# Patient Record
Sex: Female | Born: 1935 | Race: White | Hispanic: No | Marital: Married | Smoking: Former smoker
Health system: Southern US, Community
[De-identification: ages and names within clinical notes are randomized; demographics above are authoritative.]

## PROBLEM LIST (undated history)

## (undated) DIAGNOSIS — R194 Change in bowel habit: Secondary | ICD-10-CM

## (undated) DIAGNOSIS — I10 Essential (primary) hypertension: Secondary | ICD-10-CM

## (undated) DIAGNOSIS — E785 Hyperlipidemia, unspecified: Secondary | ICD-10-CM

## (undated) DIAGNOSIS — M1990 Unspecified osteoarthritis, unspecified site: Secondary | ICD-10-CM

## (undated) DIAGNOSIS — M419 Scoliosis, unspecified: Secondary | ICD-10-CM

## (undated) HISTORY — PX: TONSILLECTOMY: SUR1361

## (undated) HISTORY — PX: CATARACT EXTRACTION: SUR2

---

## 1998-04-18 ENCOUNTER — Encounter: Payer: Self-pay | Admitting: Internal Medicine

## 1998-04-18 ENCOUNTER — Ambulatory Visit (HOSPITAL_COMMUNITY): Admission: RE | Admit: 1998-04-18 | Discharge: 1998-04-18 | Payer: Self-pay | Admitting: Internal Medicine

## 2007-07-28 ENCOUNTER — Encounter: Admission: RE | Admit: 2007-07-28 | Discharge: 2007-07-28 | Payer: Self-pay | Admitting: Family Medicine

## 2008-08-29 ENCOUNTER — Encounter: Admission: RE | Admit: 2008-08-29 | Discharge: 2008-08-29 | Payer: Self-pay | Admitting: Family Medicine

## 2009-09-25 ENCOUNTER — Encounter: Admission: RE | Admit: 2009-09-25 | Discharge: 2009-09-25 | Payer: Self-pay | Admitting: Family Medicine

## 2010-10-28 ENCOUNTER — Other Ambulatory Visit: Payer: Self-pay | Admitting: Family Medicine

## 2010-10-28 DIAGNOSIS — Z1231 Encounter for screening mammogram for malignant neoplasm of breast: Secondary | ICD-10-CM

## 2010-11-06 ENCOUNTER — Ambulatory Visit
Admission: RE | Admit: 2010-11-06 | Discharge: 2010-11-06 | Disposition: A | Discharge status: Home / Self Care | Payer: Medicare Other | Source: Ambulatory Visit | Attending: Family Medicine | Admitting: Family Medicine

## 2010-11-06 DIAGNOSIS — Z1231 Encounter for screening mammogram for malignant neoplasm of breast: Secondary | ICD-10-CM

## 2011-09-09 ENCOUNTER — Other Ambulatory Visit: Payer: Self-pay | Admitting: Orthopedic Surgery

## 2011-09-09 DIAGNOSIS — M5137 Other intervertebral disc degeneration, lumbosacral region: Secondary | ICD-10-CM

## 2011-09-11 ENCOUNTER — Ambulatory Visit
Admission: RE | Admit: 2011-09-11 | Discharge: 2011-09-11 | Disposition: A | Discharge status: Home / Self Care | Payer: Medicare Other | Source: Ambulatory Visit | Attending: Orthopedic Surgery | Admitting: Orthopedic Surgery

## 2011-09-11 ENCOUNTER — Ambulatory Visit
Admission: RE | Admit: 2011-09-11 | Discharge: 2011-09-11 | Disposition: A | Discharge status: Home / Self Care | Payer: BLUE CROSS/BLUE SHIELD | Source: Ambulatory Visit | Attending: Orthopedic Surgery | Admitting: Orthopedic Surgery

## 2011-09-11 DIAGNOSIS — M5137 Other intervertebral disc degeneration, lumbosacral region: Secondary | ICD-10-CM

## 2011-10-09 DIAGNOSIS — E785 Hyperlipidemia, unspecified: Secondary | ICD-10-CM | POA: Insufficient documentation

## 2011-10-14 DIAGNOSIS — M4120 Other idiopathic scoliosis, site unspecified: Secondary | ICD-10-CM | POA: Insufficient documentation

## 2011-12-16 DIAGNOSIS — M48061 Spinal stenosis, lumbar region without neurogenic claudication: Secondary | ICD-10-CM | POA: Insufficient documentation

## 2012-10-13 DIAGNOSIS — M810 Age-related osteoporosis without current pathological fracture: Secondary | ICD-10-CM | POA: Insufficient documentation

## 2015-08-17 ENCOUNTER — Other Ambulatory Visit: Payer: Self-pay | Admitting: Family Medicine

## 2015-08-17 DIAGNOSIS — Z1231 Encounter for screening mammogram for malignant neoplasm of breast: Secondary | ICD-10-CM

## 2015-09-05 ENCOUNTER — Ambulatory Visit
Admission: RE | Admit: 2015-09-05 | Discharge: 2015-09-05 | Disposition: A | Discharge status: Home / Self Care | Payer: Medicare Other | Source: Ambulatory Visit | Attending: Family Medicine | Admitting: Family Medicine

## 2015-09-05 DIAGNOSIS — Z1231 Encounter for screening mammogram for malignant neoplasm of breast: Secondary | ICD-10-CM

## 2016-08-04 ENCOUNTER — Other Ambulatory Visit: Payer: Self-pay | Admitting: Family Medicine

## 2016-08-04 DIAGNOSIS — Z1231 Encounter for screening mammogram for malignant neoplasm of breast: Secondary | ICD-10-CM

## 2016-09-08 ENCOUNTER — Ambulatory Visit
Admission: RE | Admit: 2016-09-08 | Discharge: 2016-09-08 | Disposition: A | Discharge status: Home / Self Care | Payer: Medicare Other | Source: Ambulatory Visit | Attending: Family Medicine | Admitting: Family Medicine

## 2016-09-08 DIAGNOSIS — Z1231 Encounter for screening mammogram for malignant neoplasm of breast: Secondary | ICD-10-CM

## 2017-08-12 ENCOUNTER — Other Ambulatory Visit: Payer: Self-pay | Admitting: Family Medicine

## 2017-08-12 DIAGNOSIS — Z1231 Encounter for screening mammogram for malignant neoplasm of breast: Secondary | ICD-10-CM

## 2017-09-14 ENCOUNTER — Ambulatory Visit
Admission: RE | Admit: 2017-09-14 | Discharge: 2017-09-14 | Disposition: A | Discharge status: Home / Self Care | Payer: Medicare Other | Source: Ambulatory Visit | Attending: Family Medicine | Admitting: Family Medicine

## 2017-09-14 DIAGNOSIS — Z1231 Encounter for screening mammogram for malignant neoplasm of breast: Secondary | ICD-10-CM

## 2017-11-20 ENCOUNTER — Encounter: Payer: Self-pay | Admitting: *Deleted

## 2017-11-23 ENCOUNTER — Ambulatory Visit: Payer: Medicare Other | Admitting: Anesthesiology

## 2017-11-23 ENCOUNTER — Encounter
Admission: RE | Disposition: A | Discharge status: Home / Self Care | Payer: Self-pay | Source: Ambulatory Visit | Attending: Unknown Physician Specialty

## 2017-11-23 ENCOUNTER — Other Ambulatory Visit: Payer: Self-pay

## 2017-11-23 ENCOUNTER — Ambulatory Visit
Admission: RE | Admit: 2017-11-23 | Discharge: 2017-11-23 | Disposition: A | Discharge status: Home / Self Care | Payer: Medicare Other | Source: Ambulatory Visit | Attending: Unknown Physician Specialty | Admitting: Unknown Physician Specialty

## 2017-11-23 ENCOUNTER — Encounter: Payer: Self-pay | Admitting: Anesthesiology

## 2017-11-23 DIAGNOSIS — I10 Essential (primary) hypertension: Secondary | ICD-10-CM | POA: Insufficient documentation

## 2017-11-23 DIAGNOSIS — Z79899 Other long term (current) drug therapy: Secondary | ICD-10-CM | POA: Diagnosis not present

## 2017-11-23 DIAGNOSIS — K5730 Diverticulosis of large intestine without perforation or abscess without bleeding: Secondary | ICD-10-CM | POA: Insufficient documentation

## 2017-11-23 DIAGNOSIS — Z87891 Personal history of nicotine dependence: Secondary | ICD-10-CM | POA: Diagnosis not present

## 2017-11-23 DIAGNOSIS — K621 Rectal polyp: Secondary | ICD-10-CM | POA: Insufficient documentation

## 2017-11-23 DIAGNOSIS — Z7983 Long term (current) use of bisphosphonates: Secondary | ICD-10-CM | POA: Diagnosis not present

## 2017-11-23 DIAGNOSIS — Z7982 Long term (current) use of aspirin: Secondary | ICD-10-CM | POA: Insufficient documentation

## 2017-11-23 DIAGNOSIS — Z1211 Encounter for screening for malignant neoplasm of colon: Secondary | ICD-10-CM | POA: Diagnosis present

## 2017-11-23 HISTORY — DX: Change in bowel habit: R19.4

## 2017-11-23 HISTORY — DX: Scoliosis, unspecified: M41.9

## 2017-11-23 HISTORY — DX: Hyperlipidemia, unspecified: E78.5

## 2017-11-23 HISTORY — DX: Unspecified osteoarthritis, unspecified site: M19.90

## 2017-11-23 HISTORY — DX: Essential (primary) hypertension: I10

## 2017-11-23 HISTORY — PX: COLONOSCOPY WITH PROPOFOL: SHX5780

## 2017-11-23 SURGERY — COLONOSCOPY WITH PROPOFOL
Anesthesia: General

## 2017-11-23 MED ORDER — PROPOFOL 500 MG/50ML IV EMUL
INTRAVENOUS | Status: DC | PRN
  Administered 2017-11-23: 150 ug/kg/min via INTRAVENOUS

## 2017-11-23 MED ORDER — SODIUM CHLORIDE 0.9 % IV SOLN
INTRAVENOUS | Status: DC
  Administered 2017-11-23 (×2): via INTRAVENOUS

## 2017-11-23 MED ORDER — PROPOFOL 10 MG/ML IV BOLUS
INTRAVENOUS | Status: DC | PRN
  Administered 2017-11-23: 40 mg via INTRAVENOUS
  Administered 2017-11-23: 10 mg via INTRAVENOUS

## 2017-11-23 MED ORDER — SODIUM CHLORIDE 0.9 % IV SOLN: INTRAVENOUS | Status: DC

## 2017-11-23 MED ORDER — LIDOCAINE HCL (PF) 2 % IJ SOLN
INTRAMUSCULAR | Status: AC
  Filled 2017-11-23: qty 10

## 2017-11-23 MED ORDER — PROPOFOL 500 MG/50ML IV EMUL
INTRAVENOUS | Status: AC
  Filled 2017-11-23: qty 50

## 2017-11-23 NOTE — Anesthesia Postprocedure Evaluation (Signed)
Anesthesia Post Note  Patient: Barbara Galvan  Procedure(s) Performed: COLONOSCOPY WITH PROPOFOL (N/A )  Patient location during evaluation: Endoscopy Anesthesia Type: General Level of consciousness: awake and alert Pain management: pain level controlled Vital Signs Assessment: post-procedure vital signs reviewed and stable Respiratory status: spontaneous breathing, nonlabored ventilation, respiratory function stable and patient connected to nasal cannula oxygen Cardiovascular status: blood pressure returned to baseline and stable Postop Assessment: no apparent nausea or vomiting Anesthetic complications: no     Last Vitals:  Vitals:   11/23/17 1223 11/23/17 1233  BP: 121/60 (!) 141/72  Pulse: 73 75  Resp: (!) 22 (!) 23  Temp:    SpO2: 100% 100%    Last Pain:  Vitals:   11/23/17 1233  TempSrc:   PainSc: 0-No pain                 Bayla Mcgovern S

## 2017-11-23 NOTE — Anesthesia Post-op Follow-up Note (Signed)
Anesthesia QCDR form completed.        

## 2017-11-23 NOTE — Anesthesia Procedure Notes (Signed)
Date/Time: 11/23/2017 11:50 AM Performed by: Henrietta HooverPope, Ameris Akamine, CRNA Pre-anesthesia Checklist: Patient identified, Emergency Drugs available, Suction available, Patient being monitored and Timeout performed Patient Re-evaluated:Patient Re-evaluated prior to induction Oxygen Delivery Method: Nasal cannula Placement Confirmation: positive ETCO2

## 2017-11-23 NOTE — Transfer of Care (Signed)
Immediate Anesthesia Transfer of Care Note  Patient: Barbara Galvan  Procedure(s) Performed: COLONOSCOPY WITH PROPOFOL (N/A )  Patient Location: PACU  Anesthesia Type:General  Level of Consciousness: awake  Airway & Oxygen Therapy: Patient Spontanous Breathing and Patient connected to nasal cannula oxygen  Post-op Assessment: Report given to RN and Post -op Vital signs reviewed and stable  Post vital signs: Reviewed and stable  Last Vitals:  Vitals Value Taken Time  BP 108/62 11/23/2017 12:13 PM  Temp 36 C 11/23/2017 12:13 PM  Pulse 72 11/23/2017 12:15 PM  Resp 24 11/23/2017 12:15 PM  SpO2 100 % 11/23/2017 12:15 PM  Vitals shown include unvalidated device data.  Last Pain:  Vitals:   11/23/17 1213  TempSrc: Tympanic  PainSc: 0-No pain         Complications: No apparent anesthesia complications

## 2017-11-23 NOTE — H&P (Signed)
Primary Care Physician:  Jerl MinaHedrick, James, MD Primary Gastroenterologist:  Dr. Mechele CollinElliott  Pre-Procedure History & Physical: HPI:  Barbara Galvan is a 84 y.o. female is here for an colonoscopy.  This is for screening.   Past Medical History:  Diagnosis Date  . Arthritis   . Elevated lipids   . Encounter for diagnostic colonoscopy due to change in bowel habits   . Hypertension   . Scoliosis     Past Surgical History:  Procedure Laterality Date  . CATARACT EXTRACTION    . TONSILLECTOMY      Prior to Admission medications   Medication Sig Start Date End Date Taking? Authorizing Provider  alendronate (FOSAMAX) 70 MG tablet Take 70 mg by mouth once a week. Take with a full glass of water on an empty stomach.   Yes [provider]  amLODipine (NORVASC) 10 MG tablet Take 10 mg by mouth daily.   Yes [provider]  aspirin EC 81 MG tablet Take 81 mg by mouth daily.   Yes [provider]  BIOGAIA PROBIOTIC (BIOGAIA PROBIOTIC) LIQD Take by mouth daily at 8 pm.   Yes [provider]  calcium carbonate (TUMS - DOSED IN MG ELEMENTAL CALCIUM) 500 MG chewable tablet Chew 1 tablet by mouth daily.   Yes [provider]  citalopram (CELEXA) 20 MG tablet Take 20 mg by mouth daily.   Yes [provider]  dicyclomine (BENTYL) 20 MG tablet Take 20 mg by mouth every 6 (six) hours.   Yes [provider]  ergocalciferol (VITAMIN D2) 50000 units capsule Take 50,000 Units by mouth once a week.   Yes [provider]  lisinopril (PRINIVIL,ZESTRIL) 10 MG tablet Take 10 mg by mouth daily.   Yes [provider]  Omega-3 Fatty Acids (FISH OIL CONCENTRATE PO) Take by mouth.   Yes [provider]  vitamin E (VITAMIN E) 1000 UNIT capsule Take 1,000 Units by mouth daily.   Yes [provider]  mupirocin ointment (BACTROBAN) 2 % Place 1 application into the nose 2 (two) times daily.    [provider]     Allergies as of 10/22/2017  . (Not on File)    History reviewed. No pertinent family history.  Social History   Socioeconomic History  . Marital status: Married    Spouse name: Not on file  . Number of children: Not on file  . Years of education: Not on file  . Highest education level: Not on file  Occupational History  . Not on file  Social Needs  . Financial resource strain: Not on file  . Food insecurity:    Worry: Not on file    Inability: Not on file  . Transportation needs:    Medical: Not on file    Non-medical: Not on file  Tobacco Use  . Smoking status: Former Smoker    Last attempt to quit: 07/17/2011    Years since quitting: 6.3  . Smokeless tobacco: Never Used  Substance and Sexual Activity  . Alcohol use: Never    Frequency: Never  . Drug use: Never  . Sexual activity: Not on file  Lifestyle  . Physical activity:    Days per week: Not on file    Minutes per session: Not on file  . Stress: Not on file  Relationships  . Social connections:    Talks on phone: Not on file    Gets together: Not on file    Attends religious  service: Not on file    Active member of club or organization: Not on file    Attends meetings of clubs or organizations: Not on file    Relationship status: Not on file  . Intimate partner violence:    Fear of current or ex partner: Not on file    Emotionally abused: Not on file    Physically abused: Not on file    Forced sexual activity: Not on file  Other Topics Concern  . Not on file  Social History Narrative  . Not on file    Review of Systems: See HPI, otherwise negative ROS  Physical Exam: BP 138/63   Pulse 72   Temp (!) 96.4 F (35.8 C) (Tympanic)   Resp 16   Ht 4\' 11"  (1.499 m)   Wt 45.4 kg (100 lb)   SpO2 100%   BMI 20.20 kg/m  General:   Alert,  pleasant and cooperative in NAD Head:  Normocephalic and atraumatic. Neck:  Supple; no masses or thyromegaly. Lungs:  Clear throughout to auscultation.     Heart:  Regular rate and rhythm. Abdomen:  Soft, nontender and nondistended. Normal bowel sounds, without guarding, and without rebound.   Neurologic:  Alert and  oriented x4;  grossly normal neurologically.  Impression/Plan: Barbara Galvan is here for an colonoscopy to be performed for screening colonoscopy.  Risks, benefits, limitations, and alternatives regarding  colonoscopy have been reviewed with the patient.  Questions have been answered.  All parties agreeable.   Lynnae Prude, MD  11/23/2017, 11:34 AM

## 2017-11-23 NOTE — Op Note (Signed)
[facility] Gastroenterology Patient Name: Barbara FasterRachel Tuma Procedure Date: 11/23/2017 11:35 AM MRN: [id number] Account #: [id number] Date of Birth: 07/11/1932 Admit Type: Outpatient Age: [age] Room: [facility]OM 1 Gender: Female Note Status: Finalized Procedure:            Colonoscopy Indications:          Screening for colorectal malignant neoplasm Providers:            Scot Junobert T. Elliott, MD Referring MD:         Rhona LeavensJames F. Burnett ShengHedrick, MD (Referring MD) Medicines:            Propofol per Anesthesia Complications:        No immediate complications. Procedure:            Pre-Anesthesia Assessment:                       - After reviewing the risks and benefits, the patient                        was deemed in satisfactory condition to undergo the                        procedure.                       After obtaining informed consent, the colonoscope was                        passed under direct vision. Throughout the procedure,                        the patient's blood pressure, pulse, and oxygen                        saturations were monitored continuously. The                        Colonoscope was introduced through the anus and                        advanced to the the cecum, identified by appendiceal                        orifice and ileocecal valve. The colonoscopy was                        performed without difficulty. The patient tolerated the                        procedure well. The quality of the bowel preparation                        was excellent. Findings:      A diminutive polyp was found in the recto-sigmoid colon. The polyp was       sessile. The polyp was removed with a hot snare. Resection and retrieval       were complete.      A few small-mouthed diverticula were found in the sigmoid colon.      The exam was otherwise without abnormality. Impression:           -  One diminutive polyp at the recto-sigmoid colon,                        removed  with a hot snare. Resected and retrieved.                       - Diverticulosis in the sigmoid colon.                       - The examination was otherwise normal. Recommendation:       - Await pathology results. Scot Jun, MD 11/23/2017 12:08:18 PM This report has been signed electronically. Number of Addenda: 0 Note Initiated On: 11/23/2017 11:35 AM Scope Withdrawal Time: 0 hours 8 minutes 59 seconds  Total Procedure Duration: 0 hours 21 minutes 37 seconds       [facility]

## 2017-11-23 NOTE — Anesthesia Preprocedure Evaluation (Addendum)
Anesthesia Evaluation  Patient identified by MRN, date of birth, ID band Patient awake    Reviewed: Allergy & Precautions, NPO status , Patient's Chart, lab work & pertinent test results, reviewed documented beta blocker date and time   Airway Mallampati: II  TM Distance: >3 FB     Dental  (+) Chipped   Pulmonary former smoker,           Cardiovascular hypertension, Pt. on medications      Neuro/Psych    GI/Hepatic   Endo/Other    Renal/GU      Musculoskeletal  (+) Arthritis ,   Abdominal   Peds  Hematology   Anesthesia Other Findings   Reproductive/Obstetrics                            Anesthesia Physical Anesthesia Plan  ASA: III  Anesthesia Plan: General   Post-op Pain Management:    Induction: Intravenous  PONV Risk Score and Plan:   Airway Management Planned:   Additional Equipment:   Intra-op Plan:   Post-operative Plan:   Informed Consent: I have reviewed the patients History and Physical, chart, labs and discussed the procedure including the risks, benefits and alternatives for the proposed anesthesia with the patient or authorized representative who has indicated his/her understanding and acceptance.     Plan Discussed with: CRNA  Anesthesia Plan Comments:         Anesthesia Quick Evaluation

## 2017-11-24 ENCOUNTER — Encounter: Payer: Self-pay | Admitting: Unknown Physician Specialty

## 2017-11-24 LAB — SURGICAL PATHOLOGY

## 2019-02-03 ENCOUNTER — Other Ambulatory Visit: Payer: Self-pay | Admitting: Student

## 2019-02-03 ENCOUNTER — Ambulatory Visit
Admission: RE | Admit: 2019-02-03 | Discharge: 2019-02-03 | Disposition: A | Discharge status: Home / Self Care | Payer: Medicare Other | Source: Ambulatory Visit | Attending: Student | Admitting: Student

## 2019-02-03 ENCOUNTER — Other Ambulatory Visit: Payer: Self-pay

## 2019-02-03 DIAGNOSIS — M79662 Pain in left lower leg: Secondary | ICD-10-CM | POA: Insufficient documentation

## 2019-02-03 DIAGNOSIS — R600 Localized edema: Secondary | ICD-10-CM

## 2019-08-19 ENCOUNTER — Other Ambulatory Visit: Payer: Self-pay | Admitting: Family Medicine

## 2019-08-19 DIAGNOSIS — Z1231 Encounter for screening mammogram for malignant neoplasm of breast: Secondary | ICD-10-CM

## 2019-09-07 ENCOUNTER — Other Ambulatory Visit: Payer: Self-pay

## 2019-09-07 ENCOUNTER — Ambulatory Visit
Admission: RE | Admit: 2019-09-07 | Discharge: 2019-09-07 | Disposition: A | Discharge status: Home / Self Care | Payer: Medicare Other | Source: Ambulatory Visit | Attending: Family Medicine | Admitting: Family Medicine

## 2019-09-07 DIAGNOSIS — Z1231 Encounter for screening mammogram for malignant neoplasm of breast: Secondary | ICD-10-CM

## 2019-11-15 ENCOUNTER — Ambulatory Visit
Admission: RE | Admit: 2019-11-15 | Discharge: 2019-11-15 | Disposition: A | Discharge status: Home / Self Care | Payer: Medicare Other | Source: Ambulatory Visit | Attending: Physical Medicine and Rehabilitation | Admitting: Physical Medicine and Rehabilitation

## 2019-11-15 ENCOUNTER — Other Ambulatory Visit: Payer: Self-pay

## 2019-11-15 ENCOUNTER — Other Ambulatory Visit: Payer: Self-pay | Admitting: Physical Medicine and Rehabilitation

## 2019-11-15 DIAGNOSIS — M545 Low back pain, unspecified: Secondary | ICD-10-CM

## 2020-07-31 ENCOUNTER — Other Ambulatory Visit: Payer: Self-pay | Admitting: Family Medicine

## 2020-07-31 DIAGNOSIS — Z1231 Encounter for screening mammogram for malignant neoplasm of breast: Secondary | ICD-10-CM

## 2020-09-24 ENCOUNTER — Ambulatory Visit
Admission: RE | Admit: 2020-09-24 | Discharge: 2020-09-24 | Disposition: A | Discharge status: Home / Self Care | Payer: Medicare Other | Source: Ambulatory Visit | Attending: Family Medicine | Admitting: Family Medicine

## 2020-09-24 ENCOUNTER — Other Ambulatory Visit: Payer: Self-pay

## 2020-09-24 DIAGNOSIS — Z1231 Encounter for screening mammogram for malignant neoplasm of breast: Secondary | ICD-10-CM

## 2020-09-25 ENCOUNTER — Other Ambulatory Visit: Payer: Self-pay | Admitting: Family Medicine

## 2020-09-25 DIAGNOSIS — R928 Other abnormal and inconclusive findings on diagnostic imaging of breast: Secondary | ICD-10-CM

## 2020-10-15 ENCOUNTER — Ambulatory Visit: Payer: Medicare Other

## 2020-10-15 ENCOUNTER — Other Ambulatory Visit: Payer: Self-pay

## 2020-10-15 ENCOUNTER — Ambulatory Visit
Admission: RE | Admit: 2020-10-15 | Discharge: 2020-10-15 | Disposition: A | Discharge status: Home / Self Care | Payer: Medicare Other | Source: Ambulatory Visit | Attending: Family Medicine | Admitting: Family Medicine

## 2020-10-15 DIAGNOSIS — R928 Other abnormal and inconclusive findings on diagnostic imaging of breast: Secondary | ICD-10-CM

## 2021-09-10 ENCOUNTER — Other Ambulatory Visit: Payer: Self-pay | Admitting: Family Medicine

## 2021-09-10 DIAGNOSIS — Z1231 Encounter for screening mammogram for malignant neoplasm of breast: Secondary | ICD-10-CM

## 2021-10-22 ENCOUNTER — Ambulatory Visit
Admission: RE | Admit: 2021-10-22 | Discharge: 2021-10-22 | Disposition: A | Discharge status: Home / Self Care | Payer: Medicare Other | Source: Ambulatory Visit | Attending: Family Medicine | Admitting: Family Medicine

## 2021-10-22 DIAGNOSIS — Z1231 Encounter for screening mammogram for malignant neoplasm of breast: Secondary | ICD-10-CM

## 2022-09-23 ENCOUNTER — Other Ambulatory Visit: Payer: Self-pay | Admitting: Family Medicine

## 2022-09-23 DIAGNOSIS — Z1231 Encounter for screening mammogram for malignant neoplasm of breast: Secondary | ICD-10-CM

## 2022-10-29 ENCOUNTER — Ambulatory Visit: Payer: Medicare Other

## 2022-10-30 IMAGING — MG MM DIGITAL SCREENING BILAT W/ TOMO AND CAD
8 series · 9 of 24 positions shown · non-contrast
Comparison: Previous exam(s).

CLINICAL DATA: Screening.

EXAM:
DIGITAL SCREENING BILATERAL MAMMOGRAM WITH TOMOSYNTHESIS AND CAD
TECHNIQUE: Bilateral screening digital craniocaudal and mediolateral oblique
mammograms were obtained. Bilateral screening digital breast
tomosynthesis was performed. The images were evaluated with
computer-aided detection.

[L MLO synth-2D]
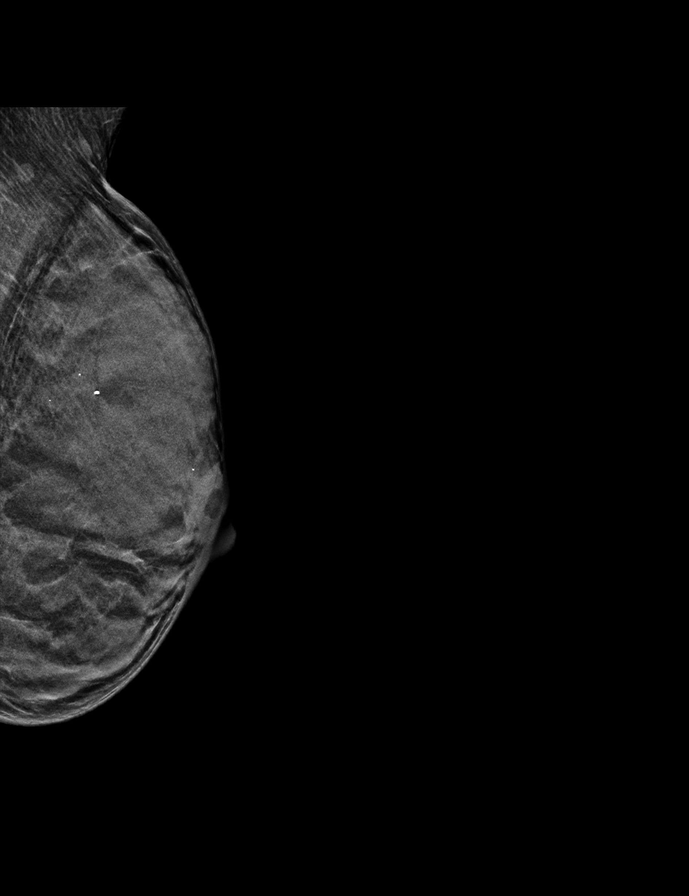

[L CC synth-2D]
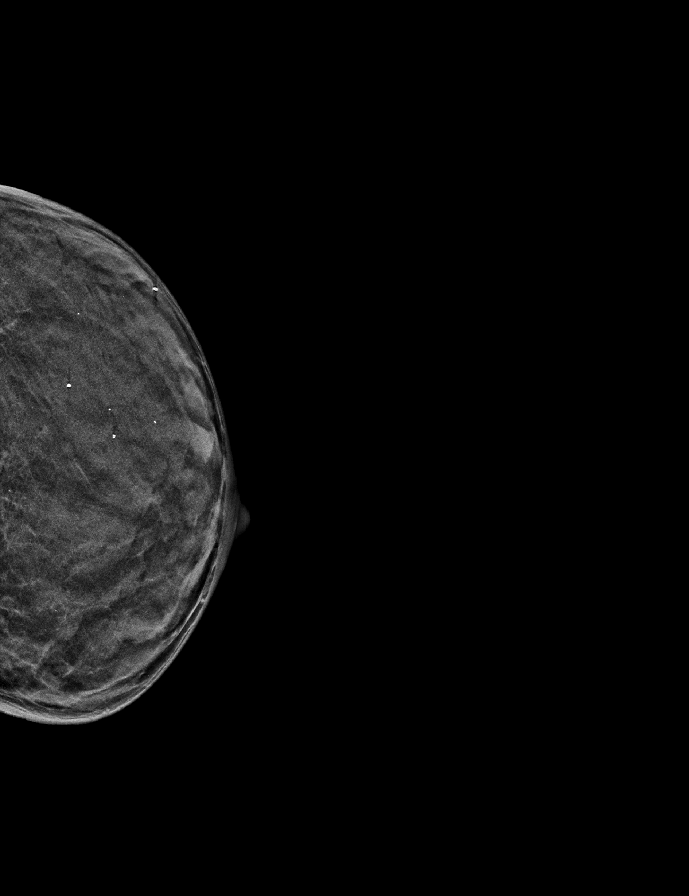

[R CC synth-2D]
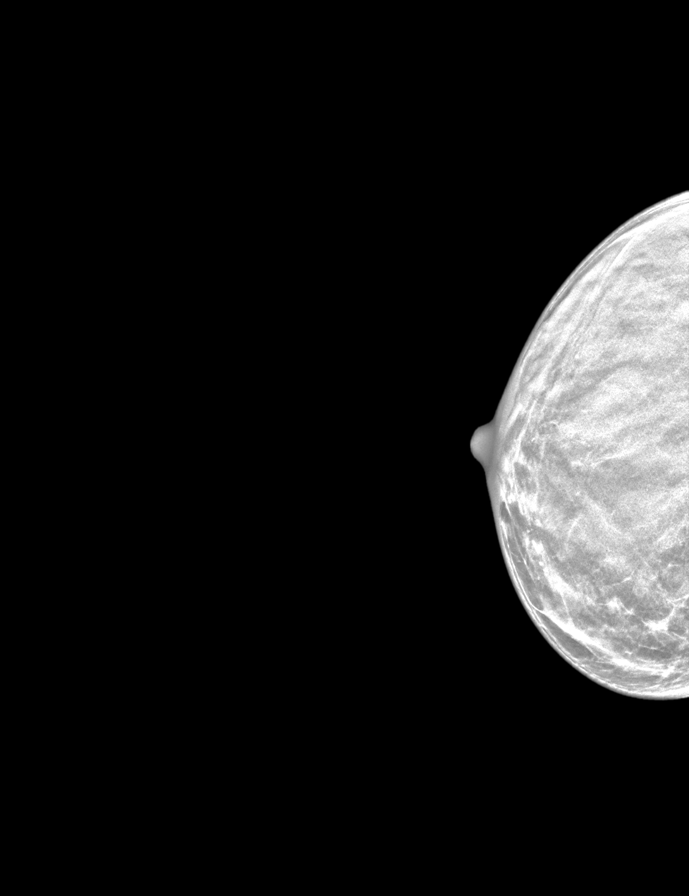

[R MLO synth-2D]
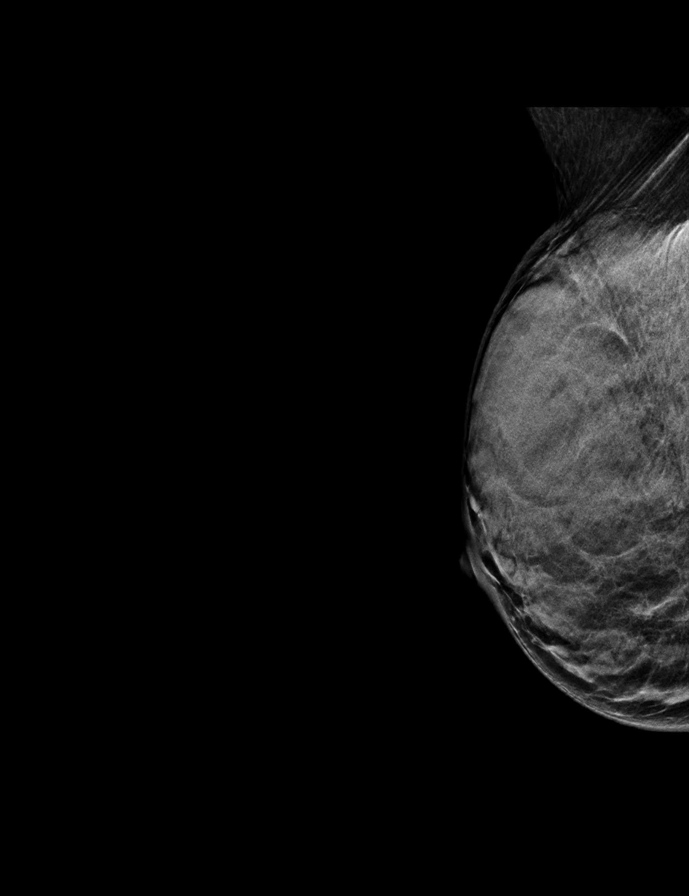

[R CC tomo · 2 of 32 frames shown]
[frame 11/32]
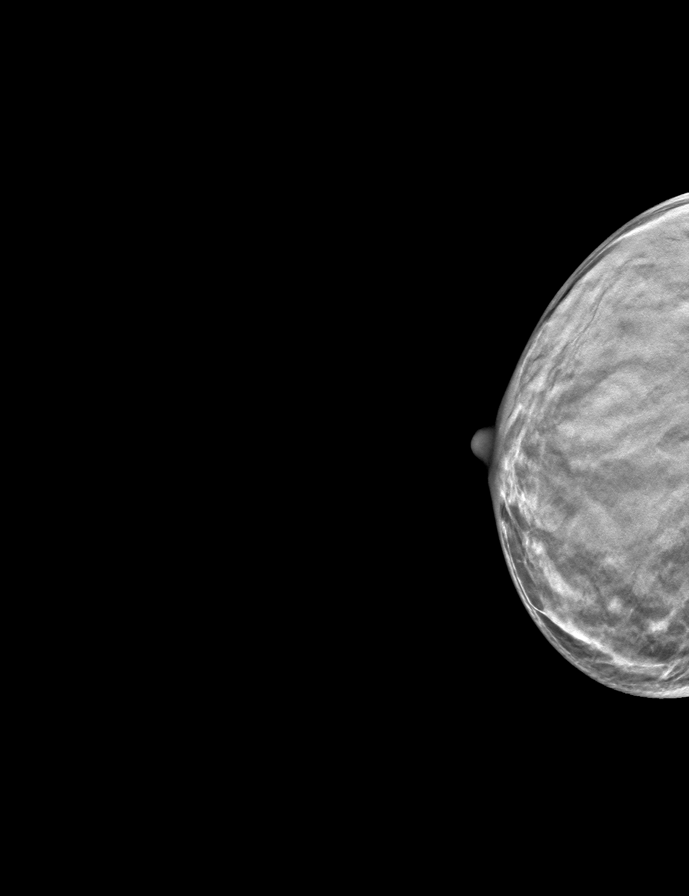
[frame 17/32]
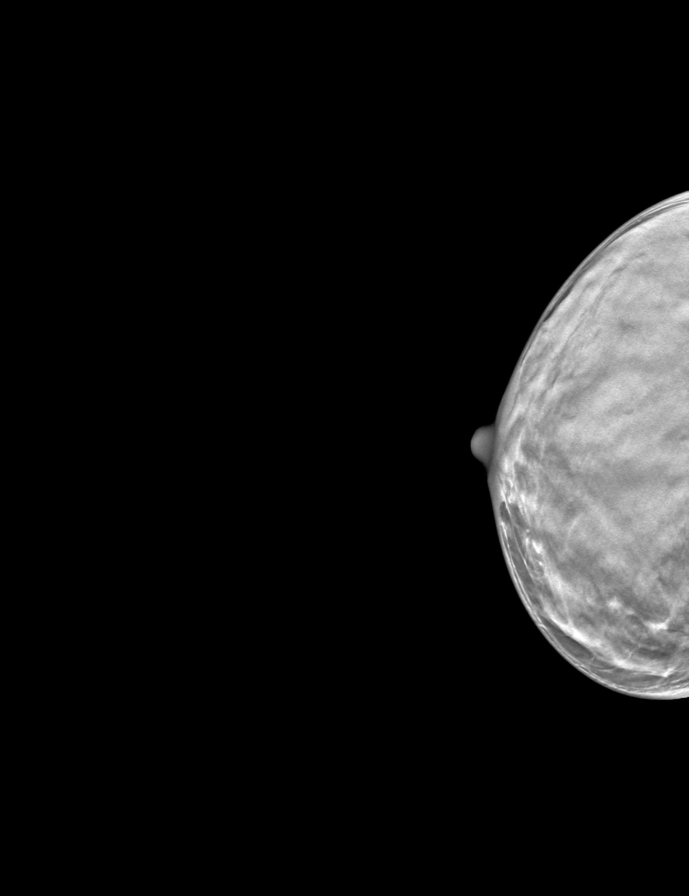

[L MLO tomo · tomo slice 15/29.0]
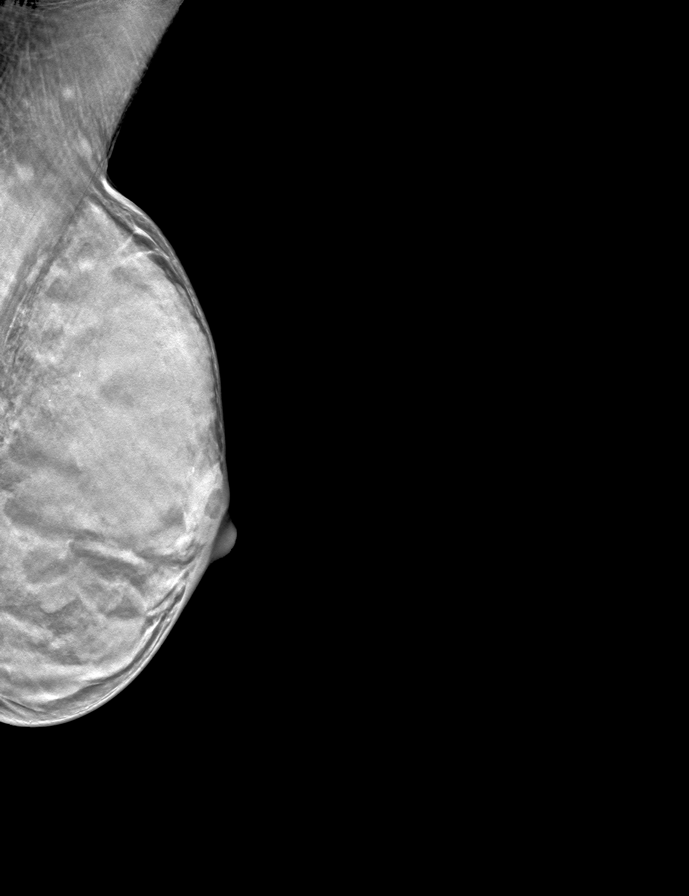

[L CC tomo · tomo slice 15/30.0]
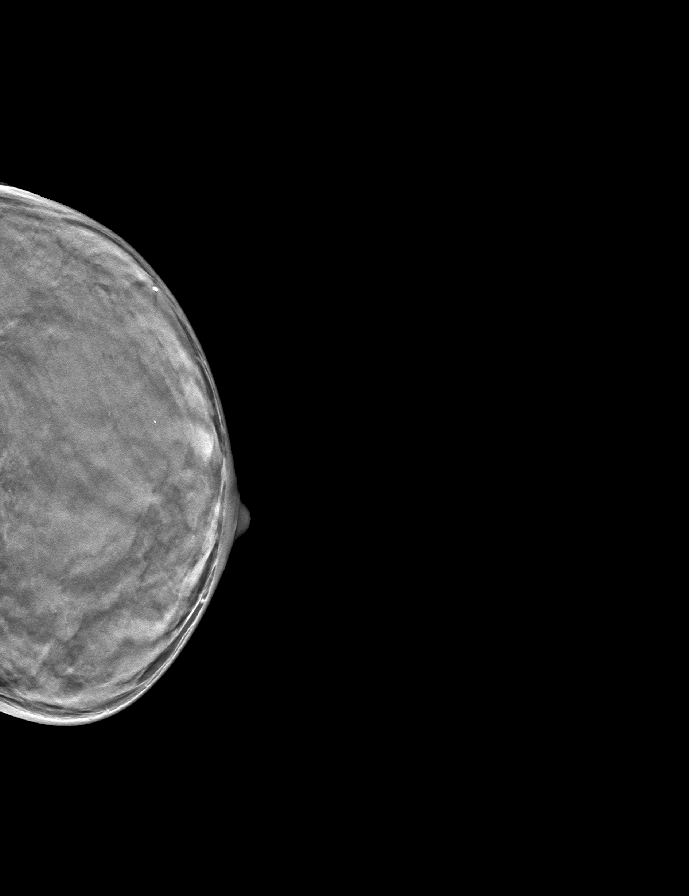

[R MLO tomo · tomo slice 15/30.0]
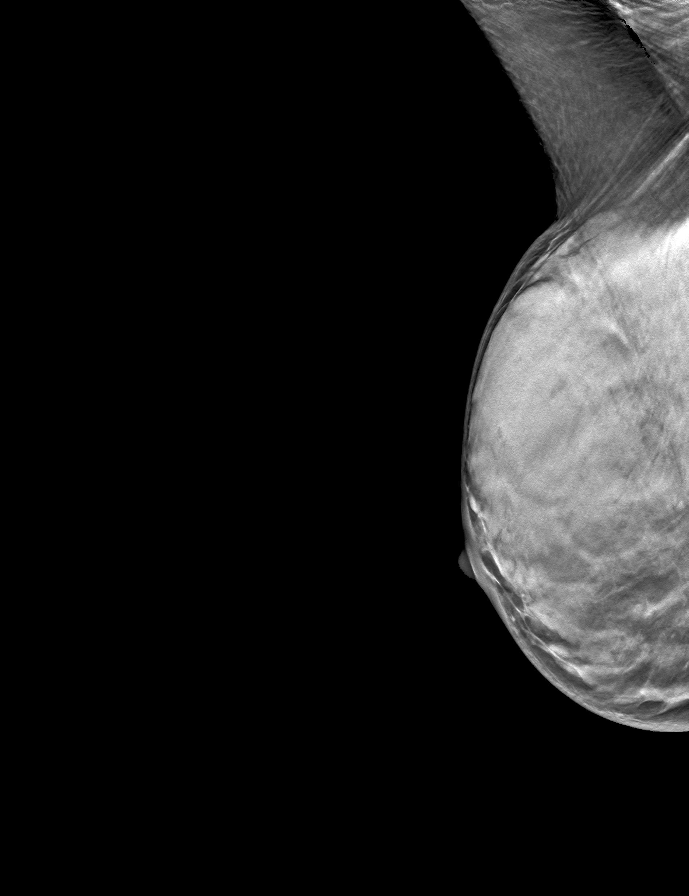

[9 of 24 positions shown; findings below may reference images not displayed]

ACR Breast Density Category d: The breast tissue is extremely dense,
which lowers the sensitivity of mammography
FINDINGS: There are no findings suspicious for malignancy.
IMPRESSION: No mammographic evidence of malignancy. A result letter of this
screening mammogram will be mailed directly to the patient.

RECOMMENDATION:
Screening mammogram in one year. (Code:[id number])

BI-RADS CATEGORY  1: Negative.

## 2022-11-19 ENCOUNTER — Ambulatory Visit
Admission: RE | Admit: 2022-11-19 | Discharge: 2022-11-19 | Disposition: A | Discharge status: Home / Self Care | Payer: Medicare Other | Source: Ambulatory Visit | Attending: Family Medicine | Admitting: Family Medicine

## 2022-11-19 DIAGNOSIS — Z1231 Encounter for screening mammogram for malignant neoplasm of breast: Secondary | ICD-10-CM

## 2023-05-15 ENCOUNTER — Ambulatory Visit: Payer: Medicare Other | Admitting: Cardiology

## 2023-07-14 ENCOUNTER — Emergency Department: Payer: Medicare Other

## 2023-07-14 ENCOUNTER — Other Ambulatory Visit: Payer: Self-pay

## 2023-07-14 ENCOUNTER — Inpatient Hospital Stay
Admission: EM | Admit: 2023-07-14 | Discharge: 2023-07-18 | DRG: 481 | Disposition: A | Discharge status: Skilled Nursing Facility | Payer: Medicare Other | Attending: Internal Medicine | Admitting: Internal Medicine

## 2023-07-14 DIAGNOSIS — Z79899 Other long term (current) drug therapy: Secondary | ICD-10-CM

## 2023-07-14 DIAGNOSIS — D62 Acute posthemorrhagic anemia: Secondary | ICD-10-CM | POA: Diagnosis not present

## 2023-07-14 DIAGNOSIS — I10 Essential (primary) hypertension: Secondary | ICD-10-CM | POA: Diagnosis present

## 2023-07-14 DIAGNOSIS — S0990XA Unspecified injury of head, initial encounter: Secondary | ICD-10-CM | POA: Diagnosis present

## 2023-07-14 DIAGNOSIS — S72142A Displaced intertrochanteric fracture of left femur, initial encounter for closed fracture: Principal | ICD-10-CM | POA: Diagnosis present

## 2023-07-14 DIAGNOSIS — Z7982 Long term (current) use of aspirin: Secondary | ICD-10-CM | POA: Diagnosis not present

## 2023-07-14 DIAGNOSIS — Z681 Body mass index (BMI) 19 or less, adult: Secondary | ICD-10-CM | POA: Diagnosis not present

## 2023-07-14 DIAGNOSIS — Z9849 Cataract extraction status, unspecified eye: Secondary | ICD-10-CM | POA: Diagnosis not present

## 2023-07-14 DIAGNOSIS — M419 Scoliosis, unspecified: Secondary | ICD-10-CM | POA: Diagnosis present

## 2023-07-14 DIAGNOSIS — G8929 Other chronic pain: Secondary | ICD-10-CM | POA: Diagnosis present

## 2023-07-14 DIAGNOSIS — Z87891 Personal history of nicotine dependence: Secondary | ICD-10-CM

## 2023-07-14 DIAGNOSIS — Z7983 Long term (current) use of bisphosphonates: Secondary | ICD-10-CM | POA: Diagnosis not present

## 2023-07-14 DIAGNOSIS — M25551 Pain in right hip: Secondary | ICD-10-CM | POA: Diagnosis present

## 2023-07-14 DIAGNOSIS — E440 Moderate protein-calorie malnutrition: Secondary | ICD-10-CM | POA: Diagnosis present

## 2023-07-14 DIAGNOSIS — Y92007 Garden or yard of unspecified non-institutional (private) residence as the place of occurrence of the external cause: Secondary | ICD-10-CM | POA: Diagnosis not present

## 2023-07-14 DIAGNOSIS — D72829 Elevated white blood cell count, unspecified: Secondary | ICD-10-CM | POA: Diagnosis present

## 2023-07-14 DIAGNOSIS — W19XXXA Unspecified fall, initial encounter: Secondary | ICD-10-CM

## 2023-07-14 DIAGNOSIS — W010XXA Fall on same level from slipping, tripping and stumbling without subsequent striking against object, initial encounter: Secondary | ICD-10-CM | POA: Diagnosis present

## 2023-07-14 DIAGNOSIS — E871 Hypo-osmolality and hyponatremia: Secondary | ICD-10-CM | POA: Diagnosis present

## 2023-07-14 DIAGNOSIS — D649 Anemia, unspecified: Secondary | ICD-10-CM | POA: Insufficient documentation

## 2023-07-14 DIAGNOSIS — S72002A Fracture of unspecified part of neck of left femur, initial encounter for closed fracture: Secondary | ICD-10-CM | POA: Diagnosis not present

## 2023-07-14 LAB — CBC WITH DIFFERENTIAL/PLATELET
Abs Immature Granulocytes: 0.1 10*3/uL — ABNORMAL HIGH (ref 0.00–0.07)
Basophils Absolute: 0.1 10*3/uL (ref 0.0–0.1)
Basophils Relative: 1 %
Eosinophils Absolute: 0 10*3/uL (ref 0.0–0.5)
Eosinophils Relative: 0 %
HCT: 30.7 % — ABNORMAL LOW (ref 36.0–46.0)
Hemoglobin: 10.6 g/dL — ABNORMAL LOW (ref 12.0–15.0)
Immature Granulocytes: 1 %
Lymphocytes Relative: 5 %
Lymphs Abs: 1 10*3/uL (ref 0.7–4.0)
MCH: 29 pg (ref 26.0–34.0)
MCHC: 34.5 g/dL (ref 30.0–36.0)
MCV: 84.1 fL (ref 80.0–100.0)
Monocytes Absolute: 1.1 10*3/uL — ABNORMAL HIGH (ref 0.1–1.0)
Monocytes Relative: 5 %
Neutro Abs: 18.6 10*3/uL — ABNORMAL HIGH (ref 1.7–7.7)
Neutrophils Relative %: 88 %
Platelets: 337 10*3/uL (ref 150–400)
RBC: 3.65 MIL/uL — ABNORMAL LOW (ref 3.87–5.11)
RDW: 13.7 % (ref 11.5–15.5)
WBC: 20.9 10*3/uL — ABNORMAL HIGH (ref 4.0–10.5)
nRBC: 0 % (ref 0.0–0.2)

## 2023-07-14 LAB — COMPREHENSIVE METABOLIC PANEL
ALT: 25 U/L (ref 0–44)
AST: 27 U/L (ref 15–41)
Albumin: 3.7 g/dL (ref 3.5–5.0)
Alkaline Phosphatase: 61 U/L (ref 38–126)
Anion gap: 12 (ref 5–15)
BUN: 21 mg/dL (ref 8–23)
CO2: 24 mmol/L (ref 22–32)
Calcium: 8.7 mg/dL — ABNORMAL LOW (ref 8.9–10.3)
Chloride: 91 mmol/L — ABNORMAL LOW (ref 98–111)
Creatinine, Ser: 0.9 mg/dL (ref 0.44–1.00)
GFR, Estimated: >60 mL/min (ref >60)
Glucose, Bld: 168 mg/dL — ABNORMAL HIGH (ref 70–99)
Potassium: 4 mmol/L (ref 3.5–5.1)
Sodium: 127 mmol/L — ABNORMAL LOW (ref 135–145)
Total Bilirubin: 0.8 mg/dL (ref 0.0–1.2)
Total Protein: 6.3 g/dL — ABNORMAL LOW (ref 6.5–8.1)

## 2023-07-14 MED ORDER — METHOCARBAMOL 500 MG PO TABS
500.0000 mg | ORAL_TABLET | Freq: Four times a day (QID) | ORAL | Status: DC | PRN
  Administered 2023-07-16 – 2023-07-18 (×4): 500 mg via ORAL
  Filled 2023-07-14 (×5): qty 1

## 2023-07-14 MED ORDER — HYDROCODONE-ACETAMINOPHEN 5-325 MG PO TABS
1.0000 | ORAL_TABLET | Freq: Four times a day (QID) | ORAL | Status: DC | PRN
Start: 2023-07-14 — End: 2023-07-15

## 2023-07-14 MED ORDER — HYDROMORPHONE HCL 1 MG/ML IJ SOLN
0.5000 mg | INTRAMUSCULAR | Status: DC | PRN
  Administered 2023-07-14 – 2023-07-15 (×2): 0.5 mg via INTRAVENOUS
  Filled 2023-07-14 (×2): qty 0.5

## 2023-07-14 MED ORDER — METHOCARBAMOL 1000 MG/10ML IJ SOLN: 500.0000 mg | Freq: Four times a day (QID) | INTRAMUSCULAR | Status: DC | PRN

## 2023-07-14 MED ORDER — SODIUM CHLORIDE 0.9 % IV BOLUS
1000.0000 mL | Freq: Once | INTRAVENOUS | Status: AC
  Administered 2023-07-14: 1000 mL via INTRAVENOUS

## 2023-07-14 MED ORDER — OXYCODONE-ACETAMINOPHEN 5-325 MG PO TABS
1.0000 | ORAL_TABLET | Freq: Once | ORAL | Status: AC
  Administered 2023-07-14: 1 via ORAL
  Filled 2023-07-14: qty 1

## 2023-07-14 MED ORDER — MORPHINE SULFATE (PF) 4 MG/ML IV SOLN
4.0000 mg | Freq: Once | INTRAVENOUS | Status: AC
  Administered 2023-07-14: 4 mg via INTRAVENOUS
  Filled 2023-07-14: qty 1

## 2023-07-14 NOTE — Assessment & Plan Note (Addendum)
 WBC 20,000 without stigmata of infection, possibly reactive from fracture Follow [facility].  Chest x-ray was normal

## 2023-07-14 NOTE — Assessment & Plan Note (Signed)
 Accidental fall Pain control N.p.o. from midnight Ortho aware and formally consulted Low risk of perioperative cardiopulmonary complications

## 2023-07-14 NOTE — Assessment & Plan Note (Addendum)
 Continue home lisinopril and amlodipine

## 2023-07-14 NOTE — Assessment & Plan Note (Addendum)
 Appears chronic and stable.  127 for the past 3 weeks Continue to monitor

## 2023-07-14 NOTE — Progress Notes (Signed)
 Imaging and labs reviewed Patient with displaced left intertrochanteric femur fracture Will need operative fixation with intramedullary nail Will review plan and surgery with patient and family in the morning Plan for OR tomorrow 07/15/2023  NPO for OR after midnight Hold chemical AC after midnight Admit to medical team for optimization, correction of electrolytes. Full consult dictation to follow  Reinaldo Berber MD

## 2023-07-14 NOTE — Assessment & Plan Note (Signed)
 Hemoglobin of 10, down from 12 No reports of prior bleeding Trend H&H Anemia panel

## 2023-07-14 NOTE — ED Provider Notes (Signed)
 [facility] Provider Note   Event Date/Time   First MD Initiated Contact with Patient 07/14/23 1647     (approximate) History  Leg Injury  HPI Barbara Galvan is a 89 y.o. female with a past medical history of of chronic hip pain and hypertension who presents complaining of of left hip pain after mechanical fall from standing in her yard.  Patient states that she tried to crawl back to her house and upon standing had another fall striking her head on the pavement in the front.  Patient has dried blood and ecchymosis to the forehead.  Patient denies any blood thinner use or loss of consciousness.  Patient denies any distal numbness or temperature change in this left leg ROS: Patient currently denies any vision changes, tinnitus, difficulty speaking, facial droop, sore throat, chest pain, shortness of breath, abdominal pain, nausea/vomiting/diarrhea, dysuria, or numbness/paresthesias in any extremity   Physical Exam  Triage Vital Signs: ED Triage Vitals  Encounter Vitals Group     BP      Systolic BP Percentile      Diastolic BP Percentile      Pulse      Resp      Temp      Temp src      SpO2      Weight      Height      Head Circumference      Peak Flow      Pain Score      Pain Loc      Pain Education      Exclude from Growth Chart    Most recent vital signs: Vitals:   07/14/23 1800 07/14/23 1830  BP: 137/68 (!) 155/75  Pulse: 88 98  Resp: 20 (!) 24  Temp:    SpO2: 96% 96%   General: Awake, oriented x4. CV:  Good peripheral perfusion.  Resp:  Normal effort.  Abd:  No distention.  Other:  Elderly well-developed, well-nourished Caucasian female resting on stretcher in mild distress secondary to pain that is worse with any movement or palpation over the lateral aspect of the left hip.  Distally neurovascularly intact in the left lower extremity ED Results / Procedures / Treatments  Labs (all labs ordered are listed, but only abnormal  results are displayed) Labs Reviewed  COMPREHENSIVE METABOLIC PANEL - Abnormal; Notable for the following components:      Result Value   Sodium 127 (*)    Chloride 91 (*)    Glucose, Bld 168 (*)    Calcium 8.7 (*)    Total Protein 6.3 (*)    All other components within normal limits  CBC WITH DIFFERENTIAL/PLATELET - Abnormal; Notable for the following components:   WBC 20.9 (*)    RBC 3.65 (*)    Hemoglobin 10.6 (*)    HCT 30.7 (*)    Neutro Abs 18.6 (*)    Monocytes Absolute 1.1 (*)    Abs Immature Granulocytes 0.10 (*)    All other components within normal limits   EKG ED ECG REPORT I, Merwyn Katos, the attending physician, personally viewed and interpreted this ECG. Date: 07/14/2023 EKG Time: 1736 Rate: 102 Rhythm: Tachycardic sinus rhythm QRS Axis: normal Intervals: normal ST/T Wave abnormalities: normal Narrative Interpretation: no evidence of acute ischemia RADIOLOGY ED MD interpretation: One-view portable chest x-ray interpreted by me shows no evidence of acute abnormalities including no pneumonia, pneumothorax, or widened mediastinum Left hip x-ray independently interpreted and  shows a displaced intertrochanteric left femur fracture  CT of the head without contrast interpreted by me shows no evidence of acute abnormalities including no intracerebral hemorrhage, obvious masses, or significant edema  CT of the cervical spine interpreted by me does not show any evidence of acute abnormalities including no acute fracture, malalignment, height loss, or dislocation.  Incidentally found old traumatic injuries -Agree with radiology assessment Official radiology report(s): DG Chest 1 View Result Date: 07/14/2023 CLINICAL DATA:  Fall, initial encounter, left hip fracture. EXAM: CHEST  1 VIEW COMPARISON:  None Available. FINDINGS: The cardiomediastinal contours are normal. Aortic atherosclerosis. Pulmonary vasculature is normal. No consolidation, pleural effusion, or  pneumothorax. No acute osseous abnormalities are seen. IMPRESSION: No active disease. Electronically Signed   By: Narda Rutherford M.D.   On: 07/14/2023 18:53   DG Hip Unilat W or Wo Pelvis 2-3 Views Left Result Date: 07/14/2023 CLINICAL DATA:  Status post fall with pain, shortening and rotation. EXAM: DG HIP (WITH OR WITHOUT PELVIS) 2-3V LEFT COMPARISON:  None Available. FINDINGS: Displaced and comminuted intertrochanteric left proximal femur fracture. Apex lateral angulation with mild proximal migration of the femoral shaft. The femoral head is seated, no hip dislocation. 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 Soft tissue edema is seen lateral to the fracture site. IMPRESSION: Displaced and comminuted intertrochanteric left proximal femur fracture. Electronically Signed   By: Narda Rutherford M.D.   On: 07/14/2023 18:53   CT Head Wo Contrast Result Date: 07/14/2023 CLINICAL DATA:  Fall with trauma to the head and neck EXAM: CT HEAD WITHOUT CONTRAST CT CERVICAL SPINE WITHOUT CONTRAST TECHNIQUE: Multidetector CT imaging of the head and cervical spine was performed following the standard protocol without intravenous contrast. Multiplanar CT image reconstructions of the cervical spine were also generated. RADIATION DOSE REDUCTION: This exam was performed according to the departmental dose-optimization program which includes automated exposure control, adjustment of the mA and/or kV according to patient size and/or use of iterative reconstruction technique. COMPARISON:  None Available. FINDINGS: CT HEAD FINDINGS Brain: Age related volume loss without subjective lobar predominance. Chronic small-vessel ischemic changes of the cerebral hemispheric white matter without evidence of acute supra tentorial stroke. Benign calcification within the right pons which could be an insignificant idiopathic calcification or could relate to a small cavernoma. Neither should be of clinical relevance. No evidence of  acute hemorrhage, mass, hydrocephalus or extra-axial collection. Vascular: There is atherosclerotic calcification of the major vessels at the base of the brain. Skull: Negative Sinuses/Orbits: Clear/normal Other: None CT CERVICAL SPINE FINDINGS Alignment: Mild scoliosis. Skull base and vertebrae: No regional fracture or focal destructive lesion. Minimal depression of the superior endplates of T2 and T3, favored to be old. Soft tissues and spinal canal: No traumatic soft tissue finding. No visible mass or adenopathy. Disc levels: Chronic degenerative spondylosis from C3-4 through C6-7. Mild osteophytic encroachment upon the canal or foramina. No severe or advanced disease. Upper chest: Scarring at the lung apices. Other: None IMPRESSION: HEAD CT: No acute or traumatic finding. Age related volume loss and chronic small-vessel ischemic changes of the white matter. CERVICAL SPINE CT: No acute or traumatic finding. Chronic degenerative spondylosis from C3-4 through C6-7. Minimal depression of the superior endplates of T2 and T3, favored to be old. Electronically Signed   By: Paulina Fusi M.D.   On: 07/14/2023 17:34   CT Cervical Spine Wo Contrast Result Date: 07/14/2023 CLINICAL DATA:  Fall with trauma to the head and neck EXAM: CT HEAD WITHOUT CONTRAST CT  CERVICAL SPINE WITHOUT CONTRAST TECHNIQUE: Multidetector CT imaging of the head and cervical spine was performed following the standard protocol without intravenous contrast. Multiplanar CT image reconstructions of the cervical spine were also generated. RADIATION DOSE REDUCTION: This exam was performed according to the departmental dose-optimization program which includes automated exposure control, adjustment of the mA and/or kV according to patient size and/or use of iterative reconstruction technique. COMPARISON:  None Available. FINDINGS: CT HEAD FINDINGS Brain: Age related volume loss without subjective lobar predominance. Chronic small-vessel ischemic changes  of the cerebral hemispheric white matter without evidence of acute supra tentorial stroke. Benign calcification within the right pons which could be an insignificant idiopathic calcification or could relate to a small cavernoma. Neither should be of clinical relevance. No evidence of acute hemorrhage, mass, hydrocephalus or extra-axial collection. Vascular: There is atherosclerotic calcification of the major vessels at the base of the brain. Skull: Negative Sinuses/Orbits: Clear/normal Other: None CT CERVICAL SPINE FINDINGS Alignment: Mild scoliosis. Skull base and vertebrae: No regional fracture or focal destructive lesion. Minimal depression of the superior endplates of T2 and T3, favored to be old. Soft tissues and spinal canal: No traumatic soft tissue finding. No visible mass or adenopathy. Disc levels: Chronic degenerative spondylosis from C3-4 through C6-7. Mild osteophytic encroachment upon the canal or foramina. No severe or advanced disease. Upper chest: Scarring at the lung apices. Other: None IMPRESSION: HEAD CT: No acute or traumatic finding. Age related volume loss and chronic small-vessel ischemic changes of the white matter. CERVICAL SPINE CT: No acute or traumatic finding. Chronic degenerative spondylosis from C3-4 through C6-7. Minimal depression of the superior endplates of T2 and T3, favored to be old. Electronically Signed   By: Paulina Fusi M.D.   On: 07/14/2023 17:34   PROCEDURES: Critical Care performed: No Procedures MEDICATIONS ORDERED IN ED: Medications  morphine (PF) 4 MG/ML injection 4 mg (4 mg Intravenous Given 07/14/23 1741)  sodium chloride 0.9 % bolus 1,000 mL (0 mLs Intravenous Stopped 07/14/23 1930)  oxyCODONE-acetaminophen (PERCOCET/ROXICET) 5-325 MG per tablet 1 tablet (1 tablet Oral Given 07/14/23 1853)   IMPRESSION / MDM / ASSESSMENT AND PLAN / ED COURSE  I reviewed the triage vital signs and the nursing notes.                             The patient is on the  cardiac monitor to evaluate for evidence of arrhythmia and/or significant heart rate changes. Patient's presentation is most consistent with acute presentation with potential threat to life or bodily function. Patient is a 89 year old female that presents for left hip pain following a mechanical fall Workup: XR hip Findings: Left intertrochanteric displaced femur fracture without dislocation Consult: Orthopedic Surgery, hospitalist  Patient does not currently demonstrate complications of fracture such as compartment syndrome, arterial or nerve injury.  Interventions: analgesia Disposition: Admit   FINAL CLINICAL IMPRESSION(S) / ED DIAGNOSES   Final diagnoses:  Displaced intertrochanteric fracture of left femur, initial encounter for closed fracture (HCC)  Fall from standing, initial encounter  Injury of head, initial encounter   Rx / DC Orders   ED Discharge Orders     None      Note:  This document was prepared using Dragon voice recognition software and may include unintentional dictation errors.   Merwyn Katos, MD 07/14/23 [phone]

## 2023-07-14 NOTE — ED Triage Notes (Addendum)
 Pt arrives via EMS for mechanical fall with no LOC. L sided hip pain. EMS gave 100 mcg of fentanyl in total. Pt hit face on ground and arrives with dried blood on R side of face. Not on any blood thinners.  EMS vitals: 150/86 BP 22 RR

## 2023-07-14 NOTE — H&P (Signed)
 History and Physical    Patient: Barbara Galvan ZOX:[id number] DOB: [birth year] DOA: 07/14/2023 DOS: the patient was seen and examined on 07/14/2023 PCP: Jerl Mina, MD  Patient coming from: Home  Chief Complaint:  Chief Complaint  Patient presents with   Leg Injury    HPI: Barbara Galvan is a 89 y.o. female with medical history significant for Chronic right hip pain,, HTN being admitted with a left intertrochanteric hip fracture after she fell in the yard.  She had to crawl to the house and that she attempted to get up she again fell forward hitting her head.  She did not lose consciousness.  She was previously in her usual state of health last saw her PCP for an annual wellness visit on 2/4.  Respiratory or GI symptoms.  No headache visual disturbance or one-sided weakness numbness or tingling ED course and data review: Mildly tachypneic on arrival with O2 sats in the mid 90s.  She was placed on 2 L. Labs notable for hyponatremia of 127(similar to 3 weeks prior on 06/23/2023), leukocytosis of 20,000, hemoglobin of 10 (baseline 12.2 on 1/28). EKG with sinus tachycardia and supraventricular bigeminy Chest x-rayCT head and C-spine nonacute next nonacute Hip x-ray showing left intertrochanteric fracture  The ED provider spoke with orthopedist, Dr. Audelia Acton who will take patient to the OR in the afternoon of 2/19  Hospitalist consulted for admission.  Past Medical History:  Diagnosis Date   Arthritis    Elevated lipids    Encounter for diagnostic colonoscopy due to change in bowel habits    Hypertension    Scoliosis    Past Surgical History:  Procedure Laterality Date   CATARACT EXTRACTION     COLONOSCOPY WITH PROPOFOL N/A 11/23/2017   Procedure: COLONOSCOPY WITH PROPOFOL;  Surgeon: Scot Jun, MD;  Location: [facility] ENDOSCOPY;  Service: Endoscopy;  Laterality: N/A;   TONSILLECTOMY     Social History:  reports that she quit smoking about 12 years ago. She has never  used smokeless tobacco. She reports that she does not drink alcohol and does not use drugs.  No Known Allergies  History reviewed. No pertinent family history.  Prior to Admission medications   Medication Sig Start Date End Date Taking? Authorizing Provider  alendronate (FOSAMAX) 70 MG tablet Take 70 mg by mouth once a week. Take with a full glass of water on an empty stomach.    [provider]  amLODipine (NORVASC) 10 MG tablet Take 10 mg by mouth daily.    [provider]  aspirin EC 81 MG tablet Take 81 mg by mouth daily.    [provider]  BIOGAIA PROBIOTIC (BIOGAIA PROBIOTIC) LIQD Take by mouth daily at 8 pm.    [provider]  calcium carbonate (TUMS - DOSED IN MG ELEMENTAL CALCIUM) 500 MG chewable tablet Chew 1 tablet by mouth daily.    [provider]  citalopram (CELEXA) 20 MG tablet Take 20 mg by mouth daily.    [provider]  dicyclomine (BENTYL) 20 MG tablet Take 20 mg by mouth every 6 (six) hours.    [provider]  ergocalciferol (VITAMIN D2) 50000 units capsule Take 50,000 Units by mouth once a week.    [provider]  lisinopril (PRINIVIL,ZESTRIL) 10 MG tablet Take 10 mg by mouth daily.    [provider]  mupirocin ointment (BACTROBAN) 2 % Place 1 application into the nose 2 (two) times daily.    [provider]  Omega-3 Fatty Acids (FISH OIL CONCENTRATE PO) Take by mouth.    [provider]  vitamin E (VITAMIN E) 1000 UNIT capsule Take 1,000 Units by mouth daily.    [provider]    Physical Exam: Vitals:   07/14/23 1700 07/14/23 1800 07/14/23 1830 07/14/23 1930  BP: (!) 149/77 137/68 (!) 155/75 119/76  Pulse: 94 88 98 93  Resp: 19 20 (!) 24   Temp:      TempSrc:      SpO2: 97% 96% 96% 96%   Physical Exam Vitals and nursing note reviewed.  Constitutional:      General: She is not in acute distress. HENT:     Head: Normocephalic.     Comments:  Bruise mid forehead Cardiovascular:     Rate and Rhythm: Normal rate and regular rhythm.     Heart sounds: Normal heart sounds.  Pulmonary:     Effort: Pulmonary effort is normal.     Breath sounds: Normal breath sounds.  Abdominal:     Palpations: Abdomen is soft.     Tenderness: There is no abdominal tenderness.  Neurological:     Mental Status: Mental status is at baseline.     Labs on Admission: I have personally reviewed following labs and imaging studies  CBC: Recent Labs  Lab 07/14/23 1655  WBC 20.9*  NEUTROABS 18.6*  HGB 10.6*  HCT 30.7*  MCV 84.1  PLT 337   Basic Metabolic Panel: Recent Labs  Lab 07/14/23 1655  NA 127*  K 4.0  CL 91*  CO2 24  GLUCOSE 168*  BUN 21  CREATININE 0.90  CALCIUM 8.7*   GFR: CrCl cannot be calculated (Unknown ideal weight.). Liver Function Tests: Recent Labs  Lab 07/14/23 1655  AST 27  ALT 25  ALKPHOS 61  BILITOT 0.8  PROT 6.3*  ALBUMIN 3.7   No results for input(s): "LIPASE", "AMYLASE" in the last 168 hours. No results for input(s): "AMMONIA" in the last 168 hours. Coagulation Profile: No results for input(s): "INR", "PROTIME" in the last 168 hours. Cardiac Enzymes: No results for input(s): "CKTOTAL", "CKMB", "CKMBINDEX", "TROPONINI" in the last 168 hours. BNP (last 3 results) No results for input(s): "PROBNP" in the last 8760 hours. HbA1C: No results for input(s): "HGBA1C" in the last 72 hours. CBG: No results for input(s): "GLUCAP" in the last 168 hours. Lipid Profile: No results for input(s): "CHOL", "HDL", "LDLCALC", "TRIG", "CHOLHDL", "LDLDIRECT" in the last 72 hours. Thyroid Function Tests: No results for input(s): "TSH", "T4TOTAL", "FREET4", "T3FREE", "THYROIDAB" in the last 72 hours. Anemia Panel: No results for input(s): "VITAMINB12", "FOLATE", "FERRITIN", "TIBC", "IRON", "RETICCTPCT" in the last 72 hours. Urine analysis: No results found for: "COLORURINE", "APPEARANCEUR", "LABSPEC", "PHURINE",  "GLUCOSEU", "HGBUR", "BILIRUBINUR", "KETONESUR", "PROTEINUR", "UROBILINOGEN", "NITRITE", "LEUKOCYTESUR"  Radiological Exams on Admission: DG Chest 1 View Result Date: 07/14/2023 CLINICAL DATA:  Fall, initial encounter, left hip fracture. EXAM: CHEST  1 VIEW COMPARISON:  None Available. FINDINGS: The cardiomediastinal contours are normal. Aortic atherosclerosis. Pulmonary vasculature is normal. No consolidation, pleural effusion, or pneumothorax. No acute osseous abnormalities are seen. IMPRESSION: No active disease. Electronically Signed   By: Narda Rutherford M.D.   On: 07/14/2023 18:53   DG Hip Unilat W or Wo Pelvis 2-3 Views Left Result Date: 07/14/2023 CLINICAL DATA:  Status post fall with pain, shortening and rotation. EXAM: DG HIP (WITH OR WITHOUT PELVIS) 2-3V LEFT COMPARISON:  None Available. FINDINGS: Displaced and comminuted intertrochanteric left proximal femur fracture. Apex lateral angulation with  mild proximal migration of the femoral shaft. The femoral head is seated, no hip dislocation. 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 Soft tissue edema is seen lateral to the fracture site. IMPRESSION: Displaced and comminuted intertrochanteric left proximal femur fracture. Electronically Signed   By: Narda Rutherford M.D.   On: 07/14/2023 18:53   CT Head Wo Contrast Result Date: 07/14/2023 CLINICAL DATA:  Fall with trauma to the head and neck EXAM: CT HEAD WITHOUT CONTRAST CT CERVICAL SPINE WITHOUT CONTRAST TECHNIQUE: Multidetector CT imaging of the head and cervical spine was performed following the standard protocol without intravenous contrast. Multiplanar CT image reconstructions of the cervical spine were also generated. RADIATION DOSE REDUCTION: This exam was performed according to the departmental dose-optimization program which includes automated exposure control, adjustment of the mA and/or kV according to patient size and/or use of iterative reconstruction technique. COMPARISON:   None Available. FINDINGS: CT HEAD FINDINGS Brain: Age related volume loss without subjective lobar predominance. Chronic small-vessel ischemic changes of the cerebral hemispheric white matter without evidence of acute supra tentorial stroke. Benign calcification within the right pons which could be an insignificant idiopathic calcification or could relate to a small cavernoma. Neither should be of clinical relevance. No evidence of acute hemorrhage, mass, hydrocephalus or extra-axial collection. Vascular: There is atherosclerotic calcification of the major vessels at the base of the brain. Skull: Negative Sinuses/Orbits: Clear/normal Other: None CT CERVICAL SPINE FINDINGS Alignment: Mild scoliosis. Skull base and vertebrae: No regional fracture or focal destructive lesion. Minimal depression of the superior endplates of T2 and T3, favored to be old. Soft tissues and spinal canal: No traumatic soft tissue finding. No visible mass or adenopathy. Disc levels: Chronic degenerative spondylosis from C3-4 through C6-7. Mild osteophytic encroachment upon the canal or foramina. No severe or advanced disease. Upper chest: Scarring at the lung apices. Other: None IMPRESSION: HEAD CT: No acute or traumatic finding. Age related volume loss and chronic small-vessel ischemic changes of the white matter. CERVICAL SPINE CT: No acute or traumatic finding. Chronic degenerative spondylosis from C3-4 through C6-7. Minimal depression of the superior endplates of T2 and T3, favored to be old. Electronically Signed   By: Paulina Fusi M.D.   On: 07/14/2023 17:34   CT Cervical Spine Wo Contrast Result Date: 07/14/2023 CLINICAL DATA:  Fall with trauma to the head and neck EXAM: CT HEAD WITHOUT CONTRAST CT CERVICAL SPINE WITHOUT CONTRAST TECHNIQUE: Multidetector CT imaging of the head and cervical spine was performed following the standard protocol without intravenous contrast. Multiplanar CT image reconstructions of the cervical spine were  also generated. RADIATION DOSE REDUCTION: This exam was performed according to the departmental dose-optimization program which includes automated exposure control, adjustment of the mA and/or kV according to patient size and/or use of iterative reconstruction technique. COMPARISON:  None Available. FINDINGS: CT HEAD FINDINGS Brain: Age related volume loss without subjective lobar predominance. Chronic small-vessel ischemic changes of the cerebral hemispheric white matter without evidence of acute supra tentorial stroke. Benign calcification within the right pons which could be an insignificant idiopathic calcification or could relate to a small cavernoma. Neither should be of clinical relevance. No evidence of acute hemorrhage, mass, hydrocephalus or extra-axial collection. Vascular: There is atherosclerotic calcification of the major vessels at the base of the brain. Skull: Negative Sinuses/Orbits: Clear/normal Other: None CT CERVICAL SPINE FINDINGS Alignment: Mild scoliosis. Skull base and vertebrae: No regional fracture or focal destructive lesion. Minimal depression of the superior endplates of T2 and T3, favored to be  old. Soft tissues and spinal canal: No traumatic soft tissue finding. No visible mass or adenopathy. Disc levels: Chronic degenerative spondylosis from C3-4 through C6-7. Mild osteophytic encroachment upon the canal or foramina. No severe or advanced disease. Upper chest: Scarring at the lung apices. Other: None IMPRESSION: HEAD CT: No acute or traumatic finding. Age related volume loss and chronic small-vessel ischemic changes of the white matter. CERVICAL SPINE CT: No acute or traumatic finding. Chronic degenerative spondylosis from C3-4 through C6-7. Minimal depression of the superior endplates of T2 and T3, favored to be old. Electronically Signed   By: Paulina Fusi M.D.   On: 07/14/2023 17:34     Data Reviewed: Relevant notes from primary care and specialist visits, past discharge  summaries as available in EHR, including Care Everywhere. Prior diagnostic testing as pertinent to current admission diagnoses Updated medications and problem lists for reconciliation ED course, including vitals, labs, imaging, treatment and response to treatment Triage notes, nursing and pharmacy notes and ED provider's notes Notable results as noted in HPI   Assessment and Plan: * Closed left hip fracture, initial encounter (HCC) Accidental fall Pain control N.p.o. from midnight Ortho aware and formally consulted Low risk of perioperative cardiopulmonary complications  Anemia Hemoglobin of 10, down from 12 No reports of prior bleeding Trend H&H Anemia panel   Leukocytosis WBC 20,000 without stigmata of infection, possibly reactive from fracture Follow [facility].  Chest x-ray was normal  Hyponatremia Appears chronic and stable.  127 for the past 3 weeks Continue to monitor  Hypertension Continue home lisinopril and amlodipine        DVT prophylaxis: SCD  Consults: ortho, Dr Audelia Acton  Advance Care Planning:   Code Status: Full Code   Family Communication: none  Disposition Plan: Back to previous home environment  Severity of Illness: The appropriate patient status for this patient is INPATIENT. Inpatient status is judged to be reasonable and necessary in order to provide the required intensity of service to ensure the patient's safety. The patient's presenting symptoms, physical exam findings, and initial radiographic and laboratory data in the context of their chronic comorbidities is felt to place them at high risk for further clinical deterioration. Furthermore, it is not anticipated that the patient will be medically stable for discharge from the hospital within 2 midnights of admission.   * I certify that at the point of admission it is my clinical judgment that the patient will require inpatient hospital care spanning beyond 2 midnights from the point of admission  due to high intensity of service, high risk for further deterioration and high frequency of surveillance required.*  Author: Andris Baumann, MD 07/14/2023 8:32 PM  For on call review [email or web address].

## 2023-07-15 ENCOUNTER — Inpatient Hospital Stay: Payer: Medicare Other

## 2023-07-15 ENCOUNTER — Other Ambulatory Visit: Payer: Self-pay

## 2023-07-15 ENCOUNTER — Encounter: Payer: Self-pay | Admitting: Internal Medicine

## 2023-07-15 ENCOUNTER — Encounter
Admission: EM | Disposition: A | Discharge status: Skilled Nursing Facility | Payer: Self-pay | Source: Home / Self Care | Attending: Internal Medicine

## 2023-07-15 DIAGNOSIS — S72142A Displaced intertrochanteric fracture of left femur, initial encounter for closed fracture: Secondary | ICD-10-CM

## 2023-07-15 DIAGNOSIS — E440 Moderate protein-calorie malnutrition: Secondary | ICD-10-CM | POA: Insufficient documentation

## 2023-07-15 HISTORY — PX: INTRAMEDULLARY (IM) NAIL INTERTROCHANTERIC: SHX5875

## 2023-07-15 LAB — CBC WITH DIFFERENTIAL/PLATELET
Abs Immature Granulocytes: 0.04 10*3/uL (ref 0.00–0.07)
Basophils Absolute: 0.1 10*3/uL (ref 0.0–0.1)
Basophils Relative: 1 %
Eosinophils Absolute: 0 10*3/uL (ref 0.0–0.5)
Eosinophils Relative: 0 %
HCT: 28.4 % — ABNORMAL LOW (ref 36.0–46.0)
Hemoglobin: 9.8 g/dL — ABNORMAL LOW (ref 12.0–15.0)
Immature Granulocytes: 0 %
Lymphocytes Relative: 10 %
Lymphs Abs: 1 10*3/uL (ref 0.7–4.0)
MCH: 29.3 pg (ref 26.0–34.0)
MCHC: 34.5 g/dL (ref 30.0–36.0)
MCV: 84.8 fL (ref 80.0–100.0)
Monocytes Absolute: 1 10*3/uL (ref 0.1–1.0)
Monocytes Relative: 10 %
Neutro Abs: 8.3 10*3/uL — ABNORMAL HIGH (ref 1.7–7.7)
Neutrophils Relative %: 79 %
Platelets: 302 10*3/uL (ref 150–400)
RBC: 3.35 MIL/uL — ABNORMAL LOW (ref 3.87–5.11)
RDW: 13.6 % (ref 11.5–15.5)
WBC: 10.5 10*3/uL (ref 4.0–10.5)
nRBC: 0 % (ref 0.0–0.2)

## 2023-07-15 LAB — BASIC METABOLIC PANEL
Anion gap: 8 (ref 5–15)
BUN: 12 mg/dL (ref 8–23)
CO2: 26 mmol/L (ref 22–32)
Calcium: 8.4 mg/dL — ABNORMAL LOW (ref 8.9–10.3)
Chloride: 95 mmol/L — ABNORMAL LOW (ref 98–111)
Creatinine, Ser: 0.7 mg/dL (ref 0.44–1.00)
GFR, Estimated: >60 mL/min (ref >60)
Glucose, Bld: 91 mg/dL (ref 70–99)
Potassium: 3.8 mmol/L (ref 3.5–5.1)
Sodium: 129 mmol/L — ABNORMAL LOW (ref 135–145)

## 2023-07-15 LAB — TYPE AND SCREEN
ABO/RH(D): A POS
Antibody Screen: NEGATIVE

## 2023-07-15 LAB — ABO/RH: ABO/RH(D): A POS

## 2023-07-15 LAB — SURGICAL PCR SCREEN
MRSA, PCR: NEGATIVE
Staphylococcus aureus: NEGATIVE

## 2023-07-15 SURGERY — FIXATION, FRACTURE, INTERTROCHANTERIC, WITH INTRAMEDULLARY ROD
Anesthesia: General | Site: Hip | Laterality: Left

## 2023-07-15 MED ORDER — METOCLOPRAMIDE HCL 10 MG PO TABS: 5.0000 mg | ORAL_TABLET | Freq: Three times a day (TID) | ORAL | Status: DC | PRN

## 2023-07-15 MED ORDER — ONDANSETRON HCL 4 MG/2ML IJ SOLN: 4.0000 mg | Freq: Once | INTRAMUSCULAR | Status: DC | PRN

## 2023-07-15 MED ORDER — LIDOCAINE HCL (PF) 2 % IJ SOLN
INTRAMUSCULAR | Status: AC
  Filled 2023-07-15: qty 5

## 2023-07-15 MED ORDER — CEFAZOLIN SODIUM-DEXTROSE 2-4 GM/100ML-% IV SOLN: 2.0000 g | Freq: Three times a day (TID) | INTRAVENOUS | Status: DC

## 2023-07-15 MED ORDER — ORAL CARE MOUTH RINSE: 15.0000 mL | OROMUCOSAL | Status: DC | PRN

## 2023-07-15 MED ORDER — MUPIROCIN 2 % EX OINT
1.0000 | TOPICAL_OINTMENT | Freq: Two times a day (BID) | CUTANEOUS | Status: DC
  Administered 2023-07-15 – 2023-07-18 (×6): 1 via NASAL
  Filled 2023-07-15: qty 22

## 2023-07-15 MED ORDER — ENSURE ENLIVE PO LIQD
237.0000 mL | Freq: Two times a day (BID) | ORAL | Status: DC
  Administered 2023-07-17 (×2): 237 mL
  Filled 2023-07-15: qty 237

## 2023-07-15 MED ORDER — PROPOFOL 500 MG/50ML IV EMUL
INTRAVENOUS | Status: DC | PRN
  Administered 2023-07-15: 30 mg via INTRAVENOUS
  Administered 2023-07-15: 25 ug/kg/min via INTRAVENOUS

## 2023-07-15 MED ORDER — LACTATED RINGERS IV SOLN: INTRAVENOUS | Status: AC

## 2023-07-15 MED ORDER — CITALOPRAM HYDROBROMIDE 10 MG PO TABS
20.0000 mg | ORAL_TABLET | Freq: Every day | ORAL | Status: DC
Start: 2023-07-15 — End: 2023-07-18
  Administered 2023-07-16 – 2023-07-18 (×3): 20 mg via ORAL
  Filled 2023-07-15 (×3): qty 2

## 2023-07-15 MED ORDER — CEFAZOLIN SODIUM-DEXTROSE 2-4 GM/100ML-% IV SOLN
2.0000 g | Freq: Four times a day (QID) | INTRAVENOUS | Status: AC
  Administered 2023-07-15 – 2023-07-16 (×2): 2 g via INTRAVENOUS
  Filled 2023-07-15 (×2): qty 100

## 2023-07-15 MED ORDER — BUPIVACAINE-EPINEPHRINE (PF) 0.25% -1:200000 IJ SOLN
INTRAMUSCULAR | Status: AC
  Filled 2023-07-15: qty 30

## 2023-07-15 MED ORDER — ONDANSETRON HCL 4 MG/2ML IJ SOLN: 4.0000 mg | Freq: Four times a day (QID) | INTRAMUSCULAR | Status: DC | PRN

## 2023-07-15 MED ORDER — TRANEXAMIC ACID-NACL 1000-0.7 MG/100ML-% IV SOLN
1000.0000 mg | INTRAVENOUS | Status: AC
  Administered 2023-07-15: 1000 mg via INTRAVENOUS
  Filled 2023-07-15: qty 100

## 2023-07-15 MED ORDER — ACETAMINOPHEN 10 MG/ML IV SOLN
INTRAVENOUS | Status: DC | PRN
  Administered 2023-07-15: 1000 mg via INTRAVENOUS

## 2023-07-15 MED ORDER — CEFAZOLIN SODIUM-DEXTROSE 2-4 GM/100ML-% IV SOLN
INTRAVENOUS | Status: AC
  Filled 2023-07-15: qty 100

## 2023-07-15 MED ORDER — FENTANYL CITRATE (PF) 100 MCG/2ML IJ SOLN: 25.0000 ug | INTRAMUSCULAR | Status: DC | PRN

## 2023-07-15 MED ORDER — ADULT MULTIVITAMIN W/MINERALS CH
1.0000 | ORAL_TABLET | Freq: Every day | ORAL | Status: DC
  Administered 2023-07-16 – 2023-07-17 (×2): 1
  Filled 2023-07-15 (×2): qty 1

## 2023-07-15 MED ORDER — MENTHOL 3 MG MT LOZG
1.0000 | LOZENGE | OROMUCOSAL | Status: DC | PRN
  Filled 2023-07-15: qty 9

## 2023-07-15 MED ORDER — LACTATED RINGERS IV SOLN: INTRAVENOUS | Status: DC

## 2023-07-15 MED ORDER — ACETAMINOPHEN 10 MG/ML IV SOLN: 15.0000 mg/kg | Freq: Once | INTRAVENOUS | Status: DC | PRN

## 2023-07-15 MED ORDER — BUPIVACAINE HCL (PF) 0.5 % IJ SOLN
INTRAMUSCULAR | Status: DC | PRN
  Administered 2023-07-15: 2 mL via INTRATHECAL

## 2023-07-15 MED ORDER — LABETALOL HCL 5 MG/ML IV SOLN
10.0000 mg | INTRAVENOUS | Status: DC | PRN
Start: 2023-07-15 — End: 2023-07-18

## 2023-07-15 MED ORDER — PHENYLEPHRINE 80 MCG/ML (10ML) SYRINGE FOR IV PUSH (FOR BLOOD PRESSURE SUPPORT)
PREFILLED_SYRINGE | INTRAVENOUS | Status: DC | PRN
  Administered 2023-07-15: 80 ug via INTRAVENOUS
  Administered 2023-07-15: 160 ug via INTRAVENOUS

## 2023-07-15 MED ORDER — ACETAMINOPHEN 10 MG/ML IV SOLN
INTRAVENOUS | Status: AC
  Filled 2023-07-15: qty 100

## 2023-07-15 MED ORDER — 0.9 % SODIUM CHLORIDE (POUR BTL) OPTIME
TOPICAL | Status: DC | PRN
  Administered 2023-07-15: 500 mL

## 2023-07-15 MED ORDER — BUPIVACAINE-EPINEPHRINE (PF) 0.25% -1:200000 IJ SOLN
INTRAMUSCULAR | Status: DC | PRN
  Administered 2023-07-15: 25 mL

## 2023-07-15 MED ORDER — ACETAMINOPHEN 500 MG PO TABS
500.0000 mg | ORAL_TABLET | Freq: Four times a day (QID) | ORAL | Status: AC
  Administered 2023-07-15 – 2023-07-16 (×2): 500 mg via ORAL
  Filled 2023-07-15 (×2): qty 1

## 2023-07-15 MED ORDER — PHENYLEPHRINE 80 MCG/ML (10ML) SYRINGE FOR IV PUSH (FOR BLOOD PRESSURE SUPPORT)
PREFILLED_SYRINGE | INTRAVENOUS | Status: AC
  Filled 2023-07-15: qty 10

## 2023-07-15 MED ORDER — ONDANSETRON HCL 4 MG PO TABS: 4.0000 mg | ORAL_TABLET | Freq: Four times a day (QID) | ORAL | Status: DC | PRN

## 2023-07-15 MED ORDER — ACETAMINOPHEN 325 MG PO TABS: 650.0000 mg | ORAL_TABLET | Freq: Four times a day (QID) | ORAL | Status: DC | PRN

## 2023-07-15 MED ORDER — PROPOFOL 10 MG/ML IV BOLUS
INTRAVENOUS | Status: AC
  Filled 2023-07-15: qty 20

## 2023-07-15 MED ORDER — BUPIVACAINE HCL (PF) 0.5 % IJ SOLN
INTRAMUSCULAR | Status: AC
  Filled 2023-07-15: qty 10

## 2023-07-15 MED ORDER — OXYCODONE HCL 5 MG PO TABS
5.0000 mg | ORAL_TABLET | ORAL | Status: DC | PRN
  Administered 2023-07-15 – 2023-07-18 (×6): 5 mg via ORAL
  Filled 2023-07-15 (×6): qty 1

## 2023-07-15 MED ORDER — LIDOCAINE HCL (PF) 2 % IJ SOLN
INTRAMUSCULAR | Status: DC | PRN
  Administered 2023-07-15: 10 mg

## 2023-07-15 MED ORDER — PROPOFOL 10 MG/ML IV BOLUS
INTRAVENOUS | Status: DC | PRN
  Administered 2023-07-15: 25 mg via INTRAVENOUS

## 2023-07-15 MED ORDER — KETAMINE HCL 50 MG/5ML IJ SOSY
PREFILLED_SYRINGE | INTRAMUSCULAR | Status: AC
  Filled 2023-07-15: qty 5

## 2023-07-15 MED ORDER — ENOXAPARIN SODIUM 40 MG/0.4ML IJ SOSY
40.0000 mg | PREFILLED_SYRINGE | INTRAMUSCULAR | Status: DC
  Administered 2023-07-16 – 2023-07-18 (×3): 40 mg via SUBCUTANEOUS
  Filled 2023-07-15 (×3): qty 0.4

## 2023-07-15 MED ORDER — PHENOL 1.4 % MT LIQD
1.0000 | OROMUCOSAL | Status: DC | PRN
  Filled 2023-07-15: qty 177

## 2023-07-15 MED ORDER — DOCUSATE SODIUM 100 MG PO CAPS
100.0000 mg | ORAL_CAPSULE | Freq: Two times a day (BID) | ORAL | Status: DC
  Administered 2023-07-15 – 2023-07-18 (×6): 100 mg via ORAL
  Filled 2023-07-15 (×6): qty 1

## 2023-07-15 MED ORDER — KETAMINE HCL 50 MG/5ML IJ SOSY
PREFILLED_SYRINGE | INTRAMUSCULAR | Status: DC | PRN
  Administered 2023-07-15: 20 mg via INTRAVENOUS

## 2023-07-15 MED ORDER — DICYCLOMINE HCL 20 MG PO TABS
20.0000 mg | ORAL_TABLET | Freq: Four times a day (QID) | ORAL | Status: DC
  Administered 2023-07-15 – 2023-07-18 (×11): 20 mg via ORAL
  Filled 2023-07-15 (×13): qty 1

## 2023-07-15 MED ORDER — MORPHINE SULFATE (PF) 2 MG/ML IV SOLN
2.0000 mg | INTRAVENOUS | Status: DC | PRN
  Administered 2023-07-15 – 2023-07-16 (×2): 2 mg via INTRAVENOUS
  Filled 2023-07-15 (×2): qty 1

## 2023-07-15 MED ORDER — CEFAZOLIN SODIUM-DEXTROSE 2-4 GM/100ML-% IV SOLN
2.0000 g | Freq: Once | INTRAVENOUS | Status: AC
  Administered 2023-07-15: 2 g via INTRAVENOUS

## 2023-07-15 MED ORDER — METOCLOPRAMIDE HCL 5 MG/ML IJ SOLN: 5.0000 mg | Freq: Three times a day (TID) | INTRAMUSCULAR | Status: DC | PRN

## 2023-07-15 SURGICAL SUPPLY — 44 items
BIT DRILL CANN 16 HIP (BIT) IMPLANT
BIT DRILL CANN STP 6/9 HIP (BIT) IMPLANT
BIT DRILL LONG 4.2 (BIT) IMPLANT
BIT DRILL TAPERED 10 (BIT) IMPLANT
BLADE HELICAL TFNA 90 (Anchor) IMPLANT
BNDG COHESIVE 6X5 TAN ST LF (GAUZE/BANDAGES/DRESSINGS) ×2 IMPLANT
CHLORAPREP W/TINT 26 (MISCELLANEOUS) ×1 IMPLANT
DERMABOND ADVANCED .7 DNX12 (GAUZE/BANDAGES/DRESSINGS) ×1 IMPLANT
DRAPE C-ARM XRAY 36X54 (DRAPES) ×1 IMPLANT
DRAPE C-ARMOR (DRAPES) IMPLANT
DRAPE SHEET LG 3/4 BI-LAMINATE (DRAPES) ×1 IMPLANT
DRSG OPSITE POSTOP 3X4 (GAUZE/BANDAGES/DRESSINGS) IMPLANT
DRSG OPSITE POSTOP 4X6 (GAUZE/BANDAGES/DRESSINGS) IMPLANT
ELECT CAUTERY BLADE 6.4 (BLADE) IMPLANT
ELECT REM PT RETURN 9FT ADLT (ELECTROSURGICAL) IMPLANT
ELECTRODE REM PT RTRN 9FT ADLT (ELECTROSURGICAL) IMPLANT
GLOVE PI ORTHO PRO STRL 7.5 (GLOVE) ×2 IMPLANT
GLOVE SURG SYN 7.5 E (GLOVE) ×1 IMPLANT
GLOVE SURG SYN 7.5 PF PI (GLOVE) ×1 IMPLANT
GOWN SRG XL LVL 3 NONREINFORCE (GOWNS) ×1 IMPLANT
GOWN STRL REUS W/ TWL LRG LVL3 (GOWN DISPOSABLE) ×1 IMPLANT
GUIDEWIRE 3.2X400 (WIRE) ×1 IMPLANT
HANDLE YANKAUER SUCT OPEN TIP (MISCELLANEOUS) ×1 IMPLANT
KIT PATIENT CARE HANA TABLE (KITS) ×1 IMPLANT
KIT TURNOVER CYSTO (KITS) ×1 IMPLANT
MANIFOLD NEPTUNE II (INSTRUMENTS) ×1 IMPLANT
MAT ABSORB FLUID 56X50 GRAY (MISCELLANEOUS) ×1 IMPLANT
NAIL TROCH FIX 10X235 LT 130 (Nail) IMPLANT
NDL HYPO 21X1.5 SAFETY (NEEDLE) ×1 IMPLANT
NEEDLE HYPO 21X1.5 SAFETY (NEEDLE) ×1 IMPLANT
NS IRRIG 500ML POUR BTL (IV SOLUTION) ×1 IMPLANT
PACK HIP COMPR (MISCELLANEOUS) ×1 IMPLANT
PAD ARMBOARD 7.5X6 YLW CONV (MISCELLANEOUS) ×1 IMPLANT
PENCIL SMOKE EVACUATOR (MISCELLANEOUS) ×1 IMPLANT
SCREW LOCK STAR 5X32 (Screw) IMPLANT
SLEEVE SCD COMPRESS KNEE MED (STOCKING) ×1 IMPLANT
SUT STRATA 1 CT-1 DLB (SUTURE) ×1 IMPLANT
SUT VIC AB 1 CT1 36 (SUTURE) ×1 IMPLANT
SUT VIC AB 2-0 CT2 27 (SUTURE) ×1 IMPLANT
SUTURE STRATA SPIR 4-0 18 (SUTURE) ×1 IMPLANT
SYR 30ML LL (SYRINGE) ×1 IMPLANT
TAPE MICROFOAM 4IN (TAPE) IMPLANT
TRAP FLUID SMOKE EVACUATOR (MISCELLANEOUS) IMPLANT
WATER STERILE IRR 1000ML POUR (IV SOLUTION) ×1 IMPLANT

## 2023-07-15 NOTE — Plan of Care (Signed)
   Problem: Education: Goal: Knowledge of General Education information will improve Description: Including pain rating scale, medication(s)/side effects and non-pharmacologic comfort measures Outcome: Progressing   Problem: Health Behavior/Discharge Planning: Goal: Ability to manage health-related needs will improve Outcome: Progressing   Problem: Clinical Measurements: Goal: Will remain free from infection Outcome: Progressing

## 2023-07-15 NOTE — Consult Note (Addendum)
 ORTHOPAEDIC CONSULTATION  REQUESTING PHYSICIAN: Tresa Moore, MD  Chief Complaint:   Left intertrochanteric femur fracture  History of Present Illness: Barbara Galvan is a 89 y.o. female with medical history significant for  HTN being admitted with a left intertrochanteric hip fracture after she fell in the yard.  The patient reports she tripped in her yard landing on the gravel and hitting her face and her left hip.  She denies any loss of consciousness from the falls while trying to get up with the broken hip.  She reports she had a history of chronic bilateral hip pain worse on the right and that it was being treated conservatively and she was managing without much difficulty with regards to her hips prior to the injury.  She denies any numbness or tingling in the leg at this time.  Denies any shortness of breath or chest pain.  Past Medical History:  Diagnosis Date   Arthritis    Elevated lipids    Encounter for diagnostic colonoscopy due to change in bowel habits    Hypertension    Scoliosis    Past Surgical History:  Procedure Laterality Date   CATARACT EXTRACTION     COLONOSCOPY WITH PROPOFOL N/A 11/23/2017   Procedure: COLONOSCOPY WITH PROPOFOL;  Surgeon: Scot Jun, MD;  Location: [facility] ENDOSCOPY;  Service: Endoscopy;  Laterality: N/A;   TONSILLECTOMY     Social History   Socioeconomic History   Marital status: Married    Spouse name: Not on file   Number of children: Not on file   Years of education: Not on file   Highest education level: Not on file  Occupational History   Not on file  Tobacco Use   Smoking status: Former    Current packs/day: 0.00    Types: Cigarettes    Quit date: 07/17/2011    Years since quitting: 12.0   Smokeless tobacco: Never  Vaping Use   Vaping status: Never Used  Substance and Sexual Activity   Alcohol use: Never   Drug use: Never   Sexual activity: Not  on file  Other Topics Concern   Not on file  Social History Narrative   Not on file   Social Drivers of Health   Financial Resource Strain: Low Risk  (06/30/2023)   Received from [facility]   Overall Financial Resource Strain (CARDIA)    Difficulty of Paying Living Expenses: Not hard at all  Food Insecurity: No Food Insecurity (07/14/2023)   Hunger Vital Sign    Worried About Running Out of Food in the Last Year: Never true    Ran Out of Food in the Last Year: Never true  Transportation Needs: No Transportation Needs (07/15/2023)   PRAPARE - Administrator, Civil Service (Medical): No    Lack of Transportation (Non-Medical): No  Physical Activity: Not on file  Stress: Not on file  Social Connections: Moderately Integrated (07/14/2023)   Social Connection and Isolation Panel [NHANES]    Frequency of Communication with Friends and Family: More than three times a week    Frequency of Social Gatherings with Friends and Family: More than three times a week    Attends Religious Services: 1 to 4 times per year    Active Member of [employer] or Organizations: Yes    Attends Banker Meetings: 1 to 4 times per year    Marital Status: Widowed   History reviewed. No pertinent family history. No Known Allergies  Prior to Admission medications   Medication Sig Start Date End Date Taking? Authorizing Provider  alendronate (FOSAMAX) 70 MG tablet Take 70 mg by mouth once a week. Take with a full glass of water on an empty stomach.    [provider]  amLODipine (NORVASC) 10 MG tablet Take 10 mg by mouth daily.    [provider]  aspirin EC 81 MG tablet Take 81 mg by mouth daily.    [provider]  BIOGAIA PROBIOTIC (BIOGAIA PROBIOTIC) LIQD Take by mouth daily at 8 pm.    [provider]  calcium carbonate (TUMS - DOSED IN MG ELEMENTAL CALCIUM) 500 MG chewable tablet Chew 1 tablet by mouth daily.    [provider]   citalopram (CELEXA) 20 MG tablet Take 20 mg by mouth daily.    [provider]  dicyclomine (BENTYL) 20 MG tablet Take 20 mg by mouth every 6 (six) hours.    [provider]  ergocalciferol (VITAMIN D2) 50000 units capsule Take 50,000 Units by mouth once a week.    [provider]  lisinopril (PRINIVIL,ZESTRIL) 10 MG tablet Take 10 mg by mouth daily.    [provider]  mupirocin ointment (BACTROBAN) 2 % Place 1 application into the nose 2 (two) times daily.    [provider]  Omega-3 Fatty Acids (FISH OIL CONCENTRATE PO) Take by mouth.    [provider]  vitamin E (VITAMIN E) 1000 UNIT capsule Take 1,000 Units by mouth daily.    [provider]   DG Femur Portable Min 2 Views Left Result Date: 07/14/2023 CLINICAL DATA:  Patient with known left intertrochanteric femoral fracture. Full length femur images requested by ordering provider. EXAM: LEFT FEMUR PORTABLE 2 VIEWS COMPARISON:  Left hip radiographs dated 07/14/2023 5:20 p.m. FINDINGS: A displaced and comminuted intertrochanteric fracture of the left proximal femur is again noted. No additional fracture identified. IMPRESSION: Redemonstrated displaced and comminuted intertrochanteric fracture of the left proximal femur. No additional fracture identified. Electronically Signed   By: Hart Robinsons M.D.   On: 07/14/2023 21:09   DG Chest 1 View Result Date: 07/14/2023 CLINICAL DATA:  Fall, initial encounter, left hip fracture. EXAM: CHEST  1 VIEW COMPARISON:  None Available. FINDINGS: The cardiomediastinal contours are normal. Aortic atherosclerosis. Pulmonary vasculature is normal. No consolidation, pleural effusion, or pneumothorax. No acute osseous abnormalities are seen. IMPRESSION: No active disease. Electronically Signed   By: Narda Rutherford M.D.   On: 07/14/2023 18:53   DG Hip Unilat W or Wo Pelvis 2-3 Views Left Result Date: 07/14/2023 CLINICAL DATA:  Status post fall with  pain, shortening and rotation. EXAM: DG HIP (WITH OR WITHOUT PELVIS) 2-3V LEFT COMPARISON:  None Available. FINDINGS: Displaced and comminuted intertrochanteric left proximal femur fracture. Apex lateral angulation with mild proximal migration of the femoral shaft. The femoral head is seated, no hip dislocation. 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 Soft tissue edema is seen lateral to the fracture site. IMPRESSION: Displaced and comminuted intertrochanteric left proximal femur fracture. Electronically Signed   By: Narda Rutherford M.D.   On: 07/14/2023 18:53   CT Head Wo Contrast Result Date: 07/14/2023 CLINICAL DATA:  Fall with trauma to the head and neck EXAM: CT HEAD WITHOUT CONTRAST CT CERVICAL SPINE WITHOUT CONTRAST TECHNIQUE: Multidetector CT imaging of the head and cervical spine was performed following the standard protocol without intravenous contrast. Multiplanar CT image reconstructions of the cervical spine were also generated. RADIATION DOSE REDUCTION: This exam  was performed according to the departmental dose-optimization program which includes automated exposure control, adjustment of the mA and/or kV according to patient size and/or use of iterative reconstruction technique. COMPARISON:  None Available. FINDINGS: CT HEAD FINDINGS Brain: Age related volume loss without subjective lobar predominance. Chronic small-vessel ischemic changes of the cerebral hemispheric white matter without evidence of acute supra tentorial stroke. Benign calcification within the right pons which could be an insignificant idiopathic calcification or could relate to a small cavernoma. Neither should be of clinical relevance. No evidence of acute hemorrhage, mass, hydrocephalus or extra-axial collection. Vascular: There is atherosclerotic calcification of the major vessels at the base of the brain. Skull: Negative Sinuses/Orbits: Clear/normal Other: None CT CERVICAL SPINE FINDINGS Alignment: Mild scoliosis.  Skull base and vertebrae: No regional fracture or focal destructive lesion. Minimal depression of the superior endplates of T2 and T3, favored to be old. Soft tissues and spinal canal: No traumatic soft tissue finding. No visible mass or adenopathy. Disc levels: Chronic degenerative spondylosis from C3-4 through C6-7. Mild osteophytic encroachment upon the canal or foramina. No severe or advanced disease. Upper chest: Scarring at the lung apices. Other: None IMPRESSION: HEAD CT: No acute or traumatic finding. Age related volume loss and chronic small-vessel ischemic changes of the white matter. CERVICAL SPINE CT: No acute or traumatic finding. Chronic degenerative spondylosis from C3-4 through C6-7. Minimal depression of the superior endplates of T2 and T3, favored to be old. Electronically Signed   By: Paulina Fusi M.D.   On: 07/14/2023 17:34   CT Cervical Spine Wo Contrast Result Date: 07/14/2023 CLINICAL DATA:  Fall with trauma to the head and neck EXAM: CT HEAD WITHOUT CONTRAST CT CERVICAL SPINE WITHOUT CONTRAST TECHNIQUE: Multidetector CT imaging of the head and cervical spine was performed following the standard protocol without intravenous contrast. Multiplanar CT image reconstructions of the cervical spine were also generated. RADIATION DOSE REDUCTION: This exam was performed according to the departmental dose-optimization program which includes automated exposure control, adjustment of the mA and/or kV according to patient size and/or use of iterative reconstruction technique. COMPARISON:  None Available. FINDINGS: CT HEAD FINDINGS Brain: Age related volume loss without subjective lobar predominance. Chronic small-vessel ischemic changes of the cerebral hemispheric white matter without evidence of acute supra tentorial stroke. Benign calcification within the right pons which could be an insignificant idiopathic calcification or could relate to a small cavernoma. Neither should be of clinical relevance.  No evidence of acute hemorrhage, mass, hydrocephalus or extra-axial collection. Vascular: There is atherosclerotic calcification of the major vessels at the base of the brain. Skull: Negative Sinuses/Orbits: Clear/normal Other: None CT CERVICAL SPINE FINDINGS Alignment: Mild scoliosis. Skull base and vertebrae: No regional fracture or focal destructive lesion. Minimal depression of the superior endplates of T2 and T3, favored to be old. Soft tissues and spinal canal: No traumatic soft tissue finding. No visible mass or adenopathy. Disc levels: Chronic degenerative spondylosis from C3-4 through C6-7. Mild osteophytic encroachment upon the canal or foramina. No severe or advanced disease. Upper chest: Scarring at the lung apices. Other: None IMPRESSION: HEAD CT: No acute or traumatic finding. Age related volume loss and chronic small-vessel ischemic changes of the white matter. CERVICAL SPINE CT: No acute or traumatic finding. Chronic degenerative spondylosis from C3-4 through C6-7. Minimal depression of the superior endplates of T2 and T3, favored to be old. Electronically Signed   By: Paulina Fusi M.D.   On: 07/14/2023 17:34    Positive ROS: All other systems  have been reviewed and were otherwise negative with the exception of those mentioned in the HPI and as above.  Physical Exam: General:  Alert, no acute distress Psychiatric:  Patient alert and oriented to person place and time Cardiovascular:  No pedal edema Respiratory:  No wheezing, non-labored breathing GI:  Abdomen is soft and non-tender Skin:  No lesions in the area of chief complaint Neurologic:  Sensation intact distally Lymphatic:  No axillary or cervical lymphadenopathy  Orthopedic Exam:  Left lower extremity Shortened and externally rotated Skin intact over the hip some ecchymosis over the ankle no open skin No tenderness over bony prominences of the rest of the lower extremity other than over the hip, specifically no tenderness  over the knee tibia ankle or foot Compartments all soft Neurovascular intact with good dorsalis pedis pulse able to dorsiflex and plantarflex the foot and toes  Secondary survey No tenderness to palpation over other bony prominences in the lower extremities or bilateral upper extremities No pain with logroll or simulated axial loading of the right lower extremity All compartments soft No tenderness to palpation over the cervical or thoracic spine, no bony step-off Motor grossly intact throughout, no focal deficits Sensation grossly intact throughout, no focal deficits Good distal pulses and capillary refill on all extremities   X-rays:  X-rays of the hip and femur images reviewed by myself which show a displaced intertrochanteric left proximal femur fracture no other fractures noted in the distal femur.  Chronic degenerative changes noted to the bilateral hips with joint space narrowing sclerosis.  Agree with radiology interpretation  Assessment: Left intertrochanteric hip fracture  Plan: Barbara Galvan is an 89 year old female presents with a displaced left intertrochanteric femur fracture.  We discussed the treatment options for femur fractures including nonoperative and operative interventions and under shared decision-making model the patient elected to move forward with surgical intervention for her left hip.  The patient will need a left hip intramedullary nail.  A long discussion took place with the patient describing what a intramedullary nail is and what the procedure would entail. The xrays were reviewed with the patient and the implants were discussed. The ability to secure the implant utilizing screw and blade fixation was discussed. Surgical exposures were discussed with the patient.    The hospitalization and post-operative care and rehabilitation were also discussed. The use of perioperative antibiotics and DVT prophylaxis were discussed. The risk, benefits and alternatives to a  surgical intervention were discussed at length with the patient. The patient was also advised of risks related to the medical comorbidities. A lengthy discussion took place to review the most common complications including but not limited to: deep vein thrombosis, pulmonary embolus, heart attack, stroke, infection, wound breakdown, dislocation, numbness, leg length in-equality, damage to nerves, intraoperative fracture, screw cut out, hardware failure, malunion/ nonunion, tendon,muscles, arteries or other blood vessels, death and other possible complications from anesthesia. The patient was told that we will take steps to minimize these risks by using sterile technique, antibiotics and DVT prophylaxis when appropriate and follow the patient postoperatively in the office setting to monitor progress. The possibility of recurrent pain, no improvement in pain and actual worsening of pain were also discussed with the patient.     The benefits of surgery were discussed with the patient including the potential for improving the patient's current clinical condition through operative intervention. Alternatives to surgical intervention including conservative management were also discussed in detail. All questions were answered to the satisfaction of the patient. The  patient participated and agreed to the plan of care as well as the use of the recommended implants for their surgery.    Plan for surgery today 07/15/2023 N.p.o.for the operating room Hold anticoagulation  Consent was signed with the patient and is on the chart I will mark the patient in preop holding prior to the surgery  Reinaldo Berber MD     Reinaldo Berber MD  Beeper #:  [phone]  07/15/2023 8:09 AM

## 2023-07-15 NOTE — Progress Notes (Signed)
 Initial Nutrition Assessment  DOCUMENTATION CODES:   Non-severe (moderate) malnutrition in context of social or environmental circumstances  INTERVENTION:   -Once diet is advanced, add:   -Ensure Enlive po BID, each supplement provides 350 kcal and 20 grams of protein.  -MVI with minerals daily  NUTRITION DIAGNOSIS:   Moderate Malnutrition related to social / environmental circumstances as evidenced by mild fat depletion, moderate fat depletion, moderate muscle depletion, severe muscle depletion.  GOAL:   Patient will meet greater than or equal to 90% of their needs  MONITOR:   PO intake, Supplement acceptance, Diet advancement  REASON FOR ASSESSMENT:   Consult Assessment of nutrition requirement/status, Hip fracture protocol  ASSESSMENT:   Pt with medical history significant for Chronic right hip pain, HTN being admitted with a left intertrochanteric hip fracture after she fell in the yard.  Pt admitted with closed lt hip fracture.   Reviewed I/O's: +700 ml x 24 hours  UOP: 300 ml x 24 hours  Case discussed with RN and orthopedics prior to evaluating pt; plan for IMN today. Pt is currently NPO for procedure.   Spoke with pt at bedside, who was pleasant and in good spirits today. She is looking forward to surgery today. Per pt, she has a good appetite and consumes 2 meals per day PTA (Breakfast: ham and egg biscuit, Dinner: vegetable plate). Pt lives alone, but has support from her brother who lives across the street. She is understand of NPO order for surgery.   Per pt, her UBW is around 100#. She shares that she has always been small framed and petite. Per pt, she has lost weight in the past (down to 90#). She shares that her husband passed away this year. Reviewed wt hx; wt has been stable over the past 3 months.   Discussed importance of good meal and supplement intake to promote healing. Pt amenable to supplements.  Medications reviewed and include lactated  ringers infusion @ 10 ml/hr, celexa, and bentyl.   Labs reviewed: Na: 127 (inpatient orders for glycemic control are none).    NUTRITION - FOCUSED PHYSICAL EXAM:  Flowsheet Row Most Recent Value  Orbital Region Moderate depletion  Upper Arm Region Moderate depletion  Thoracic and Lumbar Region Mild depletion  Buccal Region Mild depletion  Temple Region Moderate depletion  Clavicle Bone Region Severe depletion  Clavicle and Acromion Bone Region Severe depletion  Scapular Bone Region Severe depletion  Dorsal Hand Moderate depletion  Patellar Region Moderate depletion  Anterior Thigh Region Moderate depletion  Posterior Calf Region Moderate depletion  Edema (RD Assessment) Mild  Hair Reviewed  Eyes Reviewed  Mouth Reviewed  Skin Reviewed  Nails Reviewed       Diet Order:   Diet Order             Diet NPO time specified  Diet effective midnight                   EDUCATION NEEDS:   Education needs have been addressed  Skin:  Skin Assessment: Reviewed RN Assessment  Last BM:  07/15/23 (type 5)  Height:   Ht Readings from Last 1 Encounters:  07/15/23 4\' 11"  (1.499 m)    Weight:   Wt Readings from Last 1 Encounters:  07/15/23 43.1 kg    Ideal Body Weight:  44.7 kg  BMI:  Body mass index is 19.19 kg/m.  Estimated Nutritional Needs:   Kcal:  1300-1500  Protein:  65-80 grams  Fluid:  >  1.3 L    Levada Schilling, RD, LDN, CDCES Registered Dietitian III Certified Diabetes Care and Education Specialist If unable to reach this RD, please use "RD Inpatient" group chat on secure chat between hours of 8am-4 pm daily

## 2023-07-15 NOTE — Progress Notes (Signed)
 PROGRESS NOTE    Barbara Galvan  UEA:[id number] DOB: 1933-02-05 DOA: 07/14/2023 PCP: Jerl Mina, MD    Brief Narrative:   89 y.o. female with medical history significant for Chronic right hip pain,, HTN being admitted with a left intertrochanteric hip fracture after she fell in the yard.  She had to crawl to the house and that she attempted to get up she again fell forward hitting her head.  She did not lose consciousness.  She was previously in her usual state of health last saw her PCP for an annual wellness visit on 2/4.  Hip x-ray showing left intertrochanteric fracture   The ED provider spoke with orthopedist, Dr. Audelia Acton who will take patient to the OR in the afternoon of 2/19  Assessment & Plan:   Principal Problem:   Displaced intertrochanteric fracture of left femur, initial encounter for closed fracture [facility]) Active Problems:   Hypertension   Hyponatremia   Leukocytosis   Anemia   Closed left hip fracture, initial encounter (HCC) Accidental fall No evidence of syncopal event.  Orthopedics consulted from ED Plan: N.p.o. Pain control PT OT on postoperative day 1 Start chemoprophylaxis on postoperative day 1   Anemia Hemoglobin of 10, down from 12 No reports of prior bleeding Plan: Type and screen A.m. anemia panel including B12, folate,, TIBC, ferritin     Leukocytosis WBC 20K on admission.  No clinical indications of infection. Downtrending, suspect reactive in nature   Hyponatremia Appears chronic and stable.  127 for the past 3 weeks Continue to monitor   Hypertension Continue home lisinopril and amlodipine On hold for or As needed labetalol   DVT prophylaxis: on hold Code Status: FULL Family Communication:2 family members at bedside Disposition Plan: Status is: Inpatient Remains inpatient appropriate because: Hip fracture.  Operative repair today.   Level of care: Med-Surg  Consultants:  Orthopedics  Procedures:  IM  nail  Antimicrobials: None    Subjective: Seen and examined.  Resting in bed.  No visible distress.  Pain mild to moderate.  Objective: Vitals:   07/15/23 0635 07/15/23 0636 07/15/23 0818 07/15/23 1200  BP:  (!) 157/77 (!) 155/69 (!) 151/72  Pulse:  89 84 87  Resp:  16 16 20   Temp:  98.3 F (36.8 C) 97.9 F (36.6 C) 98.5 F (36.9 C)  TempSrc:   Oral Oral  SpO2:  97% 100% 96%  Weight: 43.1 kg     Height: 4\' 11"  (1.499 m)       Intake/Output Summary (Last 24 hours) at 07/15/2023 1308 Last data filed at 07/15/2023 1301 Gross per 24 hour  Intake 1100 ml  Output 300 ml  Net 800 ml   Filed Weights   07/15/23 0635  Weight: 43.1 kg    Examination:  General exam: Appears calm and comfortable  Respiratory system: Clear to auscultation. Respiratory effort normal. Cardiovascular system: S1-S2, RRR, no pedal edema, no murmurs Gastrointestinal system: Thin, soft, NT/ND, normal bowel sounds Central nervous system: Alert and oriented. No focal neurological deficits. Extremities: Symmetric 5 x 5 power. Skin: No rashes, lesions or ulcers Psychiatry: Judgement and insight appear normal. Mood & affect appropriate.     Data Reviewed: I have personally reviewed following labs and imaging studies  CBC: Recent Labs  Lab 07/14/23 1655 07/15/23 0757  WBC 20.9* 10.5  NEUTROABS 18.6* 8.3*  HGB 10.6* 9.8*  HCT 30.7* 28.4*  MCV 84.1 84.8  PLT 337 302   Basic Metabolic Panel: Recent Labs  Lab 07/14/23  1655 07/15/23 0757  NA 127* 129*  K 4.0 3.8  CL 91* 95*  CO2 24 26  GLUCOSE 168* 91  BUN 21 12  CREATININE 0.90 0.70  CALCIUM 8.7* 8.4*   GFR: Estimated Creatinine Clearance: 33.1 mL/min (by C-G formula based on SCr of 0.7 mg/dL). Liver Function Tests: Recent Labs  Lab 07/14/23 1655  AST 27  ALT 25  ALKPHOS 61  BILITOT 0.8  PROT 6.3*  ALBUMIN 3.7   No results for input(s): "LIPASE", "AMYLASE" in the last 168 hours. No results for input(s): "AMMONIA" in the last  168 hours. Coagulation Profile: No results for input(s): "INR", "PROTIME" in the last 168 hours. Cardiac Enzymes: No results for input(s): "CKTOTAL", "CKMB", "CKMBINDEX", "TROPONINI" in the last 168 hours. BNP (last 3 results) No results for input(s): "PROBNP" in the last 8760 hours. HbA1C: No results for input(s): "HGBA1C" in the last 72 hours. CBG: No results for input(s): "GLUCAP" in the last 168 hours. Lipid Profile: No results for input(s): "CHOL", "HDL", "LDLCALC", "TRIG", "CHOLHDL", "LDLDIRECT" in the last 72 hours. Thyroid Function Tests: No results for input(s): "TSH", "T4TOTAL", "FREET4", "T3FREE", "THYROIDAB" in the last 72 hours. Anemia Panel: No results for input(s): "VITAMINB12", "FOLATE", "FERRITIN", "TIBC", "IRON", "RETICCTPCT" in the last 72 hours. Sepsis Labs: No results for input(s): "PROCALCITON", "LATICACIDVEN" in the last 168 hours.  Recent Results (from the past 240 hours)  Surgical PCR screen     Status: None   Collection Time: 07/15/23  6:40 AM   Specimen: Nasal Mucosa; Nasal Swab  Result Value Ref Range Status   MRSA, PCR NEGATIVE NEGATIVE Final   Staphylococcus aureus NEGATIVE NEGATIVE Final    Comment: (NOTE) The Xpert SA Assay (FDA approved for NASAL specimens in patients 53 years of age and older), is one component of a comprehensive surveillance program. It is not intended to diagnose infection nor to guide or monitor treatment. Performed at [facility], [address] [place], [place] [address]          Radiology Studies: DG Femur Portable Min 2 Views Left Result Date: 07/14/2023 CLINICAL DATA:  Patient with known left intertrochanteric femoral fracture. Full length femur images requested by ordering provider. EXAM: LEFT FEMUR PORTABLE 2 VIEWS COMPARISON:  Left hip radiographs dated 07/14/2023 5:20 p.m. FINDINGS: A displaced and comminuted intertrochanteric fracture of the left proximal femur is again noted. No additional fracture  identified. IMPRESSION: Redemonstrated displaced and comminuted intertrochanteric fracture of the left proximal femur. No additional fracture identified. Electronically Signed   By: Hart Robinsons M.D.   On: 07/14/2023 21:09   DG Chest 1 View Result Date: 07/14/2023 CLINICAL DATA:  Fall, initial encounter, left hip fracture. EXAM: CHEST  1 VIEW COMPARISON:  None Available. FINDINGS: The cardiomediastinal contours are normal. Aortic atherosclerosis. Pulmonary vasculature is normal. No consolidation, pleural effusion, or pneumothorax. No acute osseous abnormalities are seen. IMPRESSION: No active disease. Electronically Signed   By: Narda Rutherford M.D.   On: 07/14/2023 18:53   DG Hip Unilat W or Wo Pelvis 2-3 Views Left Result Date: 07/14/2023 CLINICAL DATA:  Status post fall with pain, shortening and rotation. EXAM: DG HIP (WITH OR WITHOUT PELVIS) 2-3V LEFT COMPARISON:  None Available. FINDINGS: Displaced and comminuted intertrochanteric left proximal femur fracture. Apex lateral angulation with mild proximal migration of the femoral shaft. The femoral head is seated, no hip dislocation. 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 Soft tissue edema is seen lateral to the fracture site. IMPRESSION: Displaced and comminuted intertrochanteric  left proximal femur fracture. Electronically Signed   By: Narda Rutherford M.D.   On: 07/14/2023 18:53   CT Head Wo Contrast Result Date: 07/14/2023 CLINICAL DATA:  Fall with trauma to the head and neck EXAM: CT HEAD WITHOUT CONTRAST CT CERVICAL SPINE WITHOUT CONTRAST TECHNIQUE: Multidetector CT imaging of the head and cervical spine was performed following the standard protocol without intravenous contrast. Multiplanar CT image reconstructions of the cervical spine were also generated. RADIATION DOSE REDUCTION: This exam was performed according to the departmental dose-optimization program which includes automated exposure control, adjustment of the mA and/or  kV according to patient size and/or use of iterative reconstruction technique. COMPARISON:  None Available. FINDINGS: CT HEAD FINDINGS Brain: Age related volume loss without subjective lobar predominance. Chronic small-vessel ischemic changes of the cerebral hemispheric white matter without evidence of acute supra tentorial stroke. Benign calcification within the right pons which could be an insignificant idiopathic calcification or could relate to a small cavernoma. Neither should be of clinical relevance. No evidence of acute hemorrhage, mass, hydrocephalus or extra-axial collection. Vascular: There is atherosclerotic calcification of the major vessels at the base of the brain. Skull: Negative Sinuses/Orbits: Clear/normal Other: None CT CERVICAL SPINE FINDINGS Alignment: Mild scoliosis. Skull base and vertebrae: No regional fracture or focal destructive lesion. Minimal depression of the superior endplates of T2 and T3, favored to be old. Soft tissues and spinal canal: No traumatic soft tissue finding. No visible mass or adenopathy. Disc levels: Chronic degenerative spondylosis from C3-4 through C6-7. Mild osteophytic encroachment upon the canal or foramina. No severe or advanced disease. Upper chest: Scarring at the lung apices. Other: None IMPRESSION: HEAD CT: No acute or traumatic finding. Age related volume loss and chronic small-vessel ischemic changes of the white matter. CERVICAL SPINE CT: No acute or traumatic finding. Chronic degenerative spondylosis from C3-4 through C6-7. Minimal depression of the superior endplates of T2 and T3, favored to be old. Electronically Signed   By: Paulina Fusi M.D.   On: 07/14/2023 17:34   CT Cervical Spine Wo Contrast Result Date: 07/14/2023 CLINICAL DATA:  Fall with trauma to the head and neck EXAM: CT HEAD WITHOUT CONTRAST CT CERVICAL SPINE WITHOUT CONTRAST TECHNIQUE: Multidetector CT imaging of the head and cervical spine was performed following the standard protocol  without intravenous contrast. Multiplanar CT image reconstructions of the cervical spine were also generated. RADIATION DOSE REDUCTION: This exam was performed according to the departmental dose-optimization program which includes automated exposure control, adjustment of the mA and/or kV according to patient size and/or use of iterative reconstruction technique. COMPARISON:  None Available. FINDINGS: CT HEAD FINDINGS Brain: Age related volume loss without subjective lobar predominance. Chronic small-vessel ischemic changes of the cerebral hemispheric white matter without evidence of acute supra tentorial stroke. Benign calcification within the right pons which could be an insignificant idiopathic calcification or could relate to a small cavernoma. Neither should be of clinical relevance. No evidence of acute hemorrhage, mass, hydrocephalus or extra-axial collection. Vascular: There is atherosclerotic calcification of the major vessels at the base of the brain. Skull: Negative Sinuses/Orbits: Clear/normal Other: None CT CERVICAL SPINE FINDINGS Alignment: Mild scoliosis. Skull base and vertebrae: No regional fracture or focal destructive lesion. Minimal depression of the superior endplates of T2 and T3, favored to be old. Soft tissues and spinal canal: No traumatic soft tissue finding. No visible mass or adenopathy. Disc levels: Chronic degenerative spondylosis from C3-4 through C6-7. Mild osteophytic encroachment upon the canal or foramina. No severe or  advanced disease. Upper chest: Scarring at the lung apices. Other: None IMPRESSION: HEAD CT: No acute or traumatic finding. Age related volume loss and chronic small-vessel ischemic changes of the white matter. CERVICAL SPINE CT: No acute or traumatic finding. Chronic degenerative spondylosis from C3-4 through C6-7. Minimal depression of the superior endplates of T2 and T3, favored to be old. Electronically Signed   By: Paulina Fusi M.D.   On: 07/14/2023 17:34         Scheduled Meds:  [MAR Hold] citalopram  20 mg Oral Daily   [MAR Hold] dicyclomine  20 mg Oral Q6H   [MAR Hold] mupirocin ointment  1 Application Nasal BID   Continuous Infusions:   ceFAZolin (ANCEF) IV     lactated ringers 10 mL/hr at 07/15/23 1230   tranexamic acid       LOS: 1 day      Tresa Moore, MD [facility]   If 7PM-7AM, please contact night-coverage  07/15/2023, 1:08 PM

## 2023-07-15 NOTE — Op Note (Signed)
 Patient Name: Barbara Galvan  XBM:[id number]  Pre-Operative Diagnosis: Left hip Intertrochanteric fracture  Post-Operative Diagnosis: (same)  Procedure: Left Hip Intramedullary nail   Components/Implants: Nail:TFNA 10mm x 130 deg x long intermediate nail  Lag Blade :90mm  Locking Screw:46mm  Date of Surgery: 07/15/2023  Surgeon: Reinaldo Berber MD  Assistant: None   Anesthesiologist: Ronni Rumble  Anesthesia: Spinal   EBL: 100cc  Complications: None   Brief history: The patient is a 89 year old female who presented to the [facility] emergency room after a fall and found to have a  left hip intertrochanteric fracture.  The patient was admitted by the medical team and optimized for surgery.  A thorough discussion was had with the patient and family about the risks and benefits of surgical intervention for their hip fracture as definitive treatment.  The patient and family opted to proceed with the operation.  All preoperative films were reviewed and an appropriate surgical plan was made prior to surgery.   Description of procedure: The patient was brought to the operating room where laterality was confirmed by all those present to be the left side.  The patient was administered anesthesia on a stretcher prior to being moved supine on the operating room table. Patient was given an intravenous dose of antibiotics for surgical prophylaxis and TXA.  All bony prominences and extremities were well padded and the patient was securely attached to the table boots, a perineal post was placed and the patient had a safety strap placed.  Surgical site was prepped with alcohol and chlorhexidine. The surgical site over the hip was and draped in typical sterile fashion with multiple layers of adhesive and nonadhesive drapes.  The incision site was marked out with a sterile marker under fluoroscopic guidance.    A surgical timeout was then called with participation of all staff in  the room the patient was then a confirmed again and laterality confirmed. The hip fracture was reduced through indirect measures with traction and rotation of the leg on the fracture table. After an acceptable reduction was obtained on AP and lateral images the procedure started.  An incision was made just proximal to the greater trochanter through the skin subcutaneous tissues and an incision was made in the glut max fascia.  A guidewire was placed through the greater trochanter at the tip under x-ray guidance into the intertrochanteric region.  The position of this wire was assessed on AP and lateral fluoroscopic images to ensure position.  An opening reamer was used to create access at the tip of the greater trochanter under fluoroscopic guidance.   A size 10 x nail was advanced through the reamed hole in the proximal femur and seated within the femur to an appropriate depth under fluoroscopic guidance.  The aiming jig was used to mark an incision site for a lag blade to the femoral head which was then incised with a scalpel.  A wire was then advanced through the lateral cortex of the femur into the center of the femoral head on both AP and lateral fluoroscopic imaging stopping at the subchondral bone without penetration of the femoral head.  This wire was then used to measure for a lag screw and a size 90 blade was inserted into the femoral head through the lateral cortex of the femur and the nail under fluoroscopic guidance.  The setscrew was then tightened in the proximal part of the nail and engaged with the lag blade with a small amount of play  left so as to allow compression of the fracture.   The lag screw driver was removed and the aiming jig was switched for the distal interlocking screw.  A drill was used to drill a hole for the distal interlocking screw under fluoroscopic guidance and a size 32 screw was placed.  The intramedullary nail was assessed on AP and lateral fluoroscopic guidance  prior to removal of the aiming jig.  Final fluoroscopic x-rays were then taken after removal of the jig.  The nail was found to be in appropriate position on AP and lateral imaging with appropriate lengths of both the lag screw in the distal locking screw.  The fascia was closed with 0 Vicryl interrupted figure-of-eight sutures.  The subcutaneous tissues were closed with 2-0 Vicryl and the skin closed with 3-0 Monocryl and Dermabond.  Sterile dressings were applied to the incisions.   The patient was awoken from anesthesia transferred off of the operating room table onto a hospital bed.  The patient had a good pulse postoperatively in the foot . the patient was then transferred to the PACU in stable condition.

## 2023-07-15 NOTE — Anesthesia Preprocedure Evaluation (Addendum)
 Anesthesia Evaluation  Patient identified by MRN, date of birth, ID band Patient awake    Reviewed: Allergy & Precautions, NPO status , Patient's Chart, lab work & pertinent test results  History of Anesthesia Complications Negative for: history of anesthetic complications  Airway Mallampati: I   Neck ROM: Full    Dental no notable dental hx.    Pulmonary former smoker (quit 2013)   Pulmonary exam normal breath sounds clear to auscultation       Cardiovascular hypertension, Normal cardiovascular exam Rhythm:Regular Rate:Normal  ECG 07/14/23: Sinus tachycardia (HR 102); supraventricular bigeminy; unchanged from prior   Neuro/Psych negative neurological ROS     GI/Hepatic negative GI ROS,,,  Endo/Other  negative endocrine ROS    Renal/GU negative Renal ROS     Musculoskeletal  (+) Arthritis ,    Abdominal   Peds  Hematology  (+) Blood dyscrasia, anemia   Anesthesia Other Findings   Reproductive/Obstetrics                             Anesthesia Physical Anesthesia Plan  ASA: 2  Anesthesia Plan: General and Spinal   Post-op Pain Management:    Induction: Intravenous  PONV Risk Score and Plan: 3 and Propofol infusion, TIVA, Treatment may vary due to age or medical condition and Ondansetron  Airway Management Planned: Natural Airway and Nasal Cannula  Additional Equipment:   Intra-op Plan:   Post-operative Plan:   Informed Consent: I have reviewed the patients History and Physical, chart, labs and discussed the procedure including the risks, benefits and alternatives for the proposed anesthesia with the patient or authorized representative who has indicated his/her understanding and acceptance.       Plan Discussed with: CRNA  Anesthesia Plan Comments: (Plan for spinal and GA with natural airway, LMA/GETA backup.  Patient consented for risks of anesthesia including but not  limited to:  - adverse reactions to medications - damage to eyes, teeth, lips or other oral mucosa - nerve damage due to positioning  - sore throat or hoarseness - headache, bleeding, infection, nerve damage 2/2 spinal - damage to heart, brain, nerves, lungs, other parts of body or loss of life  Informed patient about role of CRNA in peri- and intra-operative care.  Patient voiced understanding.)        Anesthesia Quick Evaluation

## 2023-07-15 NOTE — Transfer of Care (Signed)
 Immediate Anesthesia Transfer of Care Note  Patient: Yen Wandell Class  Procedure(s) Performed: INTRAMEDULLARY (IM) NAIL INTERTROCHANTERIC (Left: Hip)  Patient Location: PACU  Anesthesia Type:General  Level of Consciousness: awake  Airway & Oxygen Therapy: Patient Spontanous Breathing  Post-op Assessment: Report given to RN and Post -op Vital signs reviewed and stable  Post vital signs: Reviewed and stable  Last Vitals:  Vitals Value Taken Time  BP 119/66 07/15/23 1402  Temp    Pulse 83 07/15/23 1406  Resp 16 07/15/23 1406  SpO2 93 % 07/15/23 1406  Vitals shown include unfiled device data.  Last Pain:  Vitals:   07/15/23 1200  TempSrc: Oral  PainSc:          Complications: There were no known notable events for this encounter.

## 2023-07-15 NOTE — Anesthesia Procedure Notes (Signed)
 Spinal  Patient location during procedure: OR Reason for block: surgical anesthesia Staffing Performed: resident/CRNA  Resident/CRNA: Mathews Argyle, CRNA Performed by: Mathews Argyle, CRNA Authorized by: Louie Boston, MD   Preanesthetic Checklist Completed: patient identified, IV checked, site marked, risks and benefits discussed, surgical consent, monitors and equipment checked, pre-op evaluation and timeout performed Spinal Block Patient position: left lateral decubitus Prep: ChloraPrep and site prepped and draped Patient monitoring: heart rate, continuous pulse ox, blood pressure and cardiac monitor Approach: midline Location: L4-5 Injection technique: single-shot Needle Needle type: Quincke  Needle gauge: 22 G Needle length: 9 cm Assessment Events: CSF return Additional Notes Negative paresthesia. Negative blood return. Positive free-flowing CSF. Expiration date of kit checked and confirmed. Patient tolerated procedure well, without complications.

## 2023-07-16 ENCOUNTER — Encounter: Payer: Self-pay | Admitting: Orthopedic Surgery

## 2023-07-16 DIAGNOSIS — S72142A Displaced intertrochanteric fracture of left femur, initial encounter for closed fracture: Secondary | ICD-10-CM | POA: Diagnosis not present

## 2023-07-16 LAB — CBC WITH DIFFERENTIAL/PLATELET
Abs Immature Granulocytes: 0.05 10*3/uL (ref 0.00–0.07)
Basophils Absolute: 0.1 10*3/uL (ref 0.0–0.1)
Basophils Relative: 1 %
Eosinophils Absolute: 0 10*3/uL (ref 0.0–0.5)
Eosinophils Relative: 0 %
HCT: 26.4 % — ABNORMAL LOW (ref 36.0–46.0)
Hemoglobin: 8.9 g/dL — ABNORMAL LOW (ref 12.0–15.0)
Immature Granulocytes: 1 %
Lymphocytes Relative: 14 %
Lymphs Abs: 1.4 10*3/uL (ref 0.7–4.0)
MCH: 29 pg (ref 26.0–34.0)
MCHC: 33.7 g/dL (ref 30.0–36.0)
MCV: 86 fL (ref 80.0–100.0)
Monocytes Absolute: 1 10*3/uL (ref 0.1–1.0)
Monocytes Relative: 10 %
Neutro Abs: 7.6 10*3/uL (ref 1.7–7.7)
Neutrophils Relative %: 74 %
Platelets: 291 10*3/uL (ref 150–400)
RBC: 3.07 MIL/uL — ABNORMAL LOW (ref 3.87–5.11)
RDW: 13.8 % (ref 11.5–15.5)
WBC: 10.1 10*3/uL (ref 4.0–10.5)
nRBC: 0 % (ref 0.0–0.2)

## 2023-07-16 LAB — BASIC METABOLIC PANEL
Anion gap: 6 (ref 5–15)
BUN: 10 mg/dL (ref 8–23)
CO2: 28 mmol/L (ref 22–32)
Calcium: 8.3 mg/dL — ABNORMAL LOW (ref 8.9–10.3)
Chloride: 96 mmol/L — ABNORMAL LOW (ref 98–111)
Creatinine, Ser: 0.78 mg/dL (ref 0.44–1.00)
GFR, Estimated: >60 mL/min (ref >60)
Glucose, Bld: 99 mg/dL (ref 70–99)
Potassium: 3.9 mmol/L (ref 3.5–5.1)
Sodium: 130 mmol/L — ABNORMAL LOW (ref 135–145)

## 2023-07-16 LAB — VITAMIN B12: Vitamin B-12: 1176 pg/mL — ABNORMAL HIGH (ref 180–914)

## 2023-07-16 LAB — IRON AND TIBC
Iron: 31 ug/dL (ref 28–170)
Saturation Ratios: 10 % — ABNORMAL LOW (ref 10.4–31.8)
TIBC: 311 ug/dL (ref 250–450)
UIBC: 280 ug/dL

## 2023-07-16 LAB — FOLATE: Folate: 30 ng/mL (ref >5.9)

## 2023-07-16 LAB — FERRITIN: Ferritin: 27 ng/mL (ref 11–307)

## 2023-07-16 MED ORDER — LISINOPRIL 10 MG PO TABS
10.0000 mg | ORAL_TABLET | Freq: Every day | ORAL | Status: DC
  Administered 2023-07-16 – 2023-07-18 (×3): 10 mg via ORAL
  Filled 2023-07-16 (×3): qty 1

## 2023-07-16 MED ORDER — AMLODIPINE BESYLATE 10 MG PO TABS
10.0000 mg | ORAL_TABLET | Freq: Every day | ORAL | Status: DC
  Administered 2023-07-16 – 2023-07-18 (×3): 10 mg via ORAL
  Filled 2023-07-16 (×3): qty 1

## 2023-07-16 NOTE — Evaluation (Signed)
 Occupational Therapy Evaluation Patient Details Name: Barbara Galvan MRN: [id number] DOB: [birth year] Today's Date: 07/16/2023   History of Present Illness   Pt is an 89 yo female s/p L IM nailing. PMH of HTN.     Clinical Impressions Barbara Galvan was seen for OT evaluation this date. Prior to hospital admission, pt was MOD I using rollator. Pt lives alone. Pt currently requires MIN - MOD A x2 exit bed. MOD A for BSC t/f. Increased time for all mobility with difficulty sequencing. Pt would benefit from skilled OT to address noted impairments and functional limitations (see below for any additional details). Upon hospital discharge, recommend OT follow up <3 hours/day.   If plan is discharge home, recommend the following:   A lot of help with walking and/or transfers;A lot of help with bathing/dressing/bathroom;Help with stairs or ramp for entrance     Functional Status Assessment   Patient has had a recent decline in their functional status and demonstrates the ability to make significant improvements in function in a reasonable and predictable amount of time.     Equipment Recommendations   BSC/3in1     Recommendations for Other Services         Precautions/Restrictions   Precautions Precautions: Fall Recall of Precautions/Restrictions: Intact Restrictions Weight Bearing Restrictions Per Provider Order: Yes LLE Weight Bearing Per Provider Order: Weight bearing as tolerated     Mobility Bed Mobility Overal bed mobility: Needs Assistance Bed Mobility: Supine to Sit     Supine to sit: Min assist, Mod assist, +2 for physical assistance          Transfers Overall transfer level: Needs assistance Equipment used: Rolling walker (2 wheels) Transfers: Sit to/from Stand, Bed to chair/wheelchair/BSC Sit to Stand: Min assist Stand pivot transfers: Mod assist                Balance Overall balance assessment: Needs assistance Sitting-balance support:  Feet supported, Bilateral upper extremity supported Sitting balance-Leahy Scale: Poor     Standing balance support: Bilateral upper extremity supported Standing balance-Leahy Scale: Poor                             ADL either performed or assessed with clinical judgement   ADL Overall ADL's : Needs assistance/impaired                                       General ADL Comments: MOD A for BSC t/f. MAX A pericare standing      Pertinent Vitals/Pain Pain Assessment Pain Assessment: Faces Faces Pain Scale: Hurts little more Pain Location: L hip Pain Descriptors / Indicators: Aching, Discomfort Pain Intervention(s): Limited activity within patient's tolerance     Extremity/Trunk Assessment Upper Extremity Assessment Upper Extremity Assessment: Defer to OT evaluation   Lower Extremity Assessment Lower Extremity Assessment: Generalized weakness (unable to lift LLE against gravity without assistance)       Communication Communication Factors Affecting Communication: Hearing impaired   Cognition Arousal: Alert Behavior During Therapy: WFL for tasks assessed/performed Cognition: No apparent impairments                               Following commands: Intact                  Home Living  Family/patient expects to be discharged to:: Private residence Living Arrangements: Alone Available Help at Discharge: Family Type of Home: House Home Access: Stairs to enter Secretary/administrator of Steps: 2 Entrance Stairs-Rails: Right;Left Home Layout: One level     Bathroom Shower/Tub: Producer, television/film/video: Standard     Home Equipment: Rollator (4 wheels);Grab bars - toilet;Grab bars - tub/shower          Prior Functioning/Environment Prior Level of Function : Independent/Modified Independent                    OT Problem List: Decreased strength;Decreased range of motion;Decreased activity  tolerance;Impaired balance (sitting and/or standing);Decreased safety awareness   OT Treatment/Interventions: Self-care/ADL training;Therapeutic exercise;Energy conservation;DME and/or AE instruction;Therapeutic activities;Patient/family education      OT Goals(Current goals can be found in the care plan section)   Acute Rehab OT Goals Patient Stated Goal: to go home OT Goal Formulation: With patient Time For Goal Achievement: 07/30/23 Potential to Achieve Goals: Good ADL Goals Pt Will Perform Grooming: with modified independence;sitting Pt Will Perform Lower Body Dressing: with min assist;sit to/from stand;with adaptive equipment Pt Will Transfer to Toilet: ambulating;bedside commode;with contact guard assist   OT Frequency:  Min 1X/week    Co-evaluation   Reason for Co-Treatment: To address functional/ADL transfers;For patient/therapist safety PT goals addressed during session: Mobility/safety with mobility;Balance;Proper use of DME OT goals addressed during session: ADL's and self-care;Proper use of Adaptive equipment and DME      AM-PAC OT "6 Clicks" Daily Activity     Outcome Measure Help from another person eating meals?: None Help from another person taking care of personal grooming?: A Little Help from another person toileting, which includes using toliet, bedpan, or urinal?: A Little Help from another person bathing (including washing, rinsing, drying)?: A Lot Help from another person to put on and taking off regular upper body clothing?: A Little Help from another person to put on and taking off regular lower body clothing?: A Lot 6 Click Score: 17   End of Session Equipment Utilized During Treatment: Gait belt;Rolling walker (2 wheels)  Activity Tolerance: Patient tolerated treatment well Patient left: in chair;with call bell/phone within reach;with chair alarm set  OT Visit Diagnosis: Other abnormalities of gait and mobility (R26.89);Muscle weakness  (generalized) (M62.81)                Time: 8295-6213 OT Time Calculation (min): 21 min Charges:  OT General Charges $OT Visit: 1 Visit OT Evaluation $OT Eval Moderate Complexity: 1 Mod  Kathie Dike, M.S. OTR/L  07/16/23, 1:06 PM  ascom [phone]

## 2023-07-16 NOTE — TOC Initial Note (Signed)
 Transition of Care [facility] - Initial/Assessment Note    Patient Details  Name: Barbara Galvan MRN: [id number] Date of Birth: [birth year]  Transition of Care [facility]) CM/SW Contact:    Barbara Reddish, RN Phone Number: 07/16/2023, 4:37 PM  Clinical Narrative:                 Chart reviewed.  Patient was admitted with left Hip fracture.    I have spoken with patient's brother Barbara Galvan. Barbara Galvan informs me that prior to admission Barbara Galvan lived by herself.  He informed me that Barbara Galvan was about 90 lbs and would fall easily.  He reports that prior to admission she was barely able to walk.  He and his wife would assist her with needs and check on her.  Barbara Galvan informed me that patient was not using a walker to get around her home but should have been using walker to get around with a home.   I have informed him that PT has recommended STR on discharge.  Barbara Galvan reports that he would only like Barbara Galvan on discharge.  He reports that their mother was their for 4 years and that is where Barbara Galvan would like to go.  I have reached out to Barbara Galvan with Barbara Galvan and made her aware that I would be sending over SNF referral.  SNF workup completed and faxed to [facility].    TOC will continue to follow for discharge planning.    Expected Discharge Plan: Skilled Nursing Facility Barriers to Discharge: No Barriers Identified   Patient Goals and CMS Choice   CMS Medicare.gov Compare Post Acute Care list provided to:: Patient Choice offered to / list presented to : Patient      Expected Discharge Plan and Services     Post Acute Care Choice:  (STR) Living arrangements for the past 2 months: Single Family Home                                      Prior Living Arrangements/Services Living arrangements for the past 2 months: Single Family Home Lives with:: Self Patient language and need for interpreter reviewed:: Yes Do you feel safe going back to the place where  you live?: Yes      Need for Family Participation in Patient Care: Yes (Comment) Care giver support system in place?: Yes (comment) (Patient has a supportive brother)      Activities of Daily Living   ADL Screening (condition at time of admission) Independently performs ADLs?: Yes (appropriate for developmental age) Is the patient deaf or have difficulty hearing?: Yes Does the patient have difficulty seeing, even when wearing glasses/contacts?: No Does the patient have difficulty concentrating, remembering, or making decisions?: No  Permission Sought/Granted                  Emotional Assessment       Orientation: : Oriented to Self, Oriented to Place, Oriented to  Time, Oriented to Situation      Admission diagnosis:  Injury of head, initial encounter [S09.90XA] Closed left hip fracture, initial encounter (HCC) [S72.002A] Fall from standing, initial encounter [W19.XXXA] Displaced intertrochanteric fracture of left femur, initial encounter for closed fracture [facility]) [S72.142A] Patient Active Problem List   Diagnosis Date Noted   Malnutrition of moderate degree 07/15/2023   Displaced intertrochanteric fracture of left femur, initial encounter for closed fracture (  HCC) 07/14/2023   Hyponatremia 07/14/2023   Leukocytosis 07/14/2023   Anemia 07/14/2023   Hypertension    PCP:  Barbara Mina, MD Pharmacy:  No Pharmacies Listed    Social Drivers of Health (SDOH) Social History: SDOH Screenings   Food Insecurity: No Food Insecurity (07/14/2023)  Housing: Low Risk  (07/14/2023)  Transportation Needs: No Transportation Needs (07/15/2023)  Utilities: Not At Risk (07/14/2023)  Financial Resource Strain: Low Risk  (06/30/2023)   Received from [facility]  Social Connections: Moderately Integrated (07/14/2023)  Tobacco Use: Medium Risk (07/15/2023)   SDOH Interventions:     Readmission Risk Interventions     No data to display

## 2023-07-16 NOTE — Progress Notes (Signed)
   Subjective: 1 Day Post-Op Procedure(s) (LRB): INTRAMEDULLARY (IM) NAIL INTERTROCHANTERIC (Left) Patient reports pain as moderate.   Patient is well, and has had no acute complaints or problems Denies any CP, SOB, ABD pain. We will continue therapy today.  Plan is to go Skilled nursing facility after hospital stay.  Objective: Vital signs in last 24 hours: Temp:  [98.1 F (36.7 C)-98.9 F (37.2 C)] 98.6 F (37 C) (02/20 0738) Pulse Rate:  [71-91] 79 (02/20 0738) Resp:  [10-20] 18 (02/20 0738) BP: (119-160)/(60-84) 160/75 (02/20 0738) SpO2:  [90 %-99 %] 98 % (02/20 0738)  Intake/Output from previous day: 02/19 0701 - 02/20 0700 In: 600 [I.V.:400; IV Piggyback:200] Out: 840 [Urine:740; Blood:100] Intake/Output this shift: No intake/output data recorded.  Recent Labs    07/14/23 1655 07/15/23 0757 07/16/23 0529  HGB 10.6* 9.8* 8.9*   Recent Labs    07/15/23 0757 07/16/23 0529  WBC 10.5 10.1  RBC 3.35* 3.07*  HCT 28.4* 26.4*  PLT 302 291   Recent Labs    07/15/23 0757 07/16/23 0529  NA 129* 130*  K 3.8 3.9  CL 95* 96*  CO2 26 28  BUN 12 10  CREATININE 0.70 0.78  GLUCOSE 91 99  CALCIUM 8.4* 8.3*   No results for input(s): "LABPT", "INR" in the last 72 hours.  EXAM General - Patient is Alert, Appropriate, and Oriented Extremity - Neurovascular intact Sensation intact distally Intact pulses distally Dorsiflexion/Plantar flexion intact No cellulitis present Compartment soft Dressing - dressing C/D/I and no drainage Motor Function - intact, moving foot and toes well on exam.   Past Medical History:  Diagnosis Date   Arthritis    Elevated lipids    Encounter for diagnostic colonoscopy due to change in bowel habits    Hypertension    Scoliosis     Assessment/Plan:   1 Day Post-Op Procedure(s) (LRB): INTRAMEDULLARY (IM) NAIL INTERTROCHANTERIC (Left) Principal Problem:   Displaced intertrochanteric fracture of left femur, initial encounter for  closed fracture (HCC) Active Problems:   Hypertension   Hyponatremia   Leukocytosis   Anemia   Malnutrition of moderate degree  Estimated body mass index is 19.19 kg/m as calculated from the following:   Height as of this encounter: 4\' 11"  (1.499 m).   Weight as of this encounter: 43.1 kg. Advance diet Up with therapy Pain well-controlled Vital signs are stable Acute postop blood loss anemia -hemoglobin 8.9.  Recheck labs tomorrow Care management to assist with discharge to skilled nursing facility  Patient will need 2-week follow-up with [facility] orthopedics Lovenox 40 mg subcu daily x 14 days at discharge  DVT Prophylaxis - Lovenox, TED hose, and SCDs Weight-Bearing as tolerated to left leg   T. Cranston Neighbor, PA-C [facility] 07/16/2023, 10:23 AM

## 2023-07-16 NOTE — [place] FL2 (Signed)
 [place] MEDICAID FL2 LEVEL OF CARE FORM     IDENTIFICATION  Patient Name: Barbara Galvan [facility] Birthdate: 1932-07-17 Sex: female Admission Date (Current Location): 07/14/2023  [facility] and [place] Number:  Chiropodist and Address:  [facility], [address], [place], [place] [address]      Provider Number: [id number]  Attending Physician Name and Address:  Tresa Moore, MD  Relative Name and Phone Number:  Charm Barges [phone]    Current Level of Care: Hospital Recommended Level of Care: Skilled Nursing Facility Prior Approval Number:    Date Approved/Denied:   PASRR Number: [id number]  Discharge Plan: SNF    Current Diagnoses: Patient Active Problem List   Diagnosis Date Noted   Malnutrition of moderate degree 07/15/2023   Displaced intertrochanteric fracture of left femur, initial encounter for closed fracture (HCC) 07/14/2023   Hyponatremia 07/14/2023   Leukocytosis 07/14/2023   Anemia 07/14/2023   Hypertension     Orientation RESPIRATION BLADDER Height & Weight     Self, Time, Situation, Place  Normal Continent Weight: 43.1 kg Height:  4\' 11"  (149.9 cm)  BEHAVIORAL SYMPTOMS/MOOD NEUROLOGICAL BOWEL NUTRITION STATUS      Continent  (See Discharge Summary)  AMBULATORY STATUS COMMUNICATION OF NEEDS Skin   Extensive Assist Verbally Normal                       Personal Care Assistance Level of Assistance  Bathing, Feeding, Dressing Bathing Assistance: Maximum assistance Feeding assistance: Limited assistance Dressing Assistance: Maximum assistance     Functional Limitations Info  Sight, Hearing, Speech Sight Info: Adequate Hearing Info: Adequate Speech Info: Adequate    SPECIAL CARE FACTORS FREQUENCY  PT (By licensed PT), OT (By licensed OT)     PT Frequency: 5x weekly OT Frequency: 5x weekly            Contractures Contractures Info: Not present    Additional Factors Info  Code  Status, Allergies Code Status Info: Full Code Allergies Info: No Known Allergies           Current Medications (07/16/2023):  This is the current hospital active medication list Current Facility-Administered Medications  Medication Dose Route Frequency Provider Last Rate Last Admin   acetaminophen (TYLENOL) tablet 500 mg  500 mg Oral Q6H Reinaldo Berber, MD   500 mg at 07/16/23 1256   acetaminophen (TYLENOL) tablet 650 mg  650 mg Oral Q6H PRN Lolita Patella B, MD       amLODipine (NORVASC) tablet 10 mg  10 mg Oral Daily Georgeann Oppenheim, Sudheer B, MD   10 mg at 07/16/23 1551   citalopram (CELEXA) tablet 20 mg  20 mg Oral Daily Lolita Patella B, MD   20 mg at 07/16/23 0944   dicyclomine (BENTYL) tablet 20 mg  20 mg Oral Q6H Sreenath, Sudheer B, MD   20 mg at 07/16/23 1551   docusate sodium (COLACE) capsule 100 mg  100 mg Oral BID Reinaldo Berber, MD   100 mg at 07/16/23 0943   enoxaparin (LOVENOX) injection 40 mg  40 mg Subcutaneous Q24H Reinaldo Berber, MD   40 mg at 07/16/23 0756   feeding supplement (ENSURE ENLIVE / ENSURE PLUS) liquid 237 mL  237 mL Per Tube BID BM Sreenath, Sudheer B, MD       labetalol (NORMODYNE) injection 10 mg  10 mg Intravenous Q4H PRN Sreenath, Sudheer B, MD       lisinopril (ZESTRIL) tablet 10 mg  10 mg Oral Daily Lolita Patella B, MD   10 mg at 07/16/23 1551   menthol-cetylpyridinium (CEPACOL) lozenge 3 mg  1 lozenge Oral PRN Reinaldo Berber, MD       Or   phenol (CHLORASEPTIC) mouth spray 1 spray  1 spray Mouth/Throat PRN Reinaldo Berber, MD       methocarbamol (ROBAXIN) tablet 500 mg  500 mg Oral Q6H PRN Andris Baumann, MD   500 mg at 07/16/23 4098   Or   methocarbamol (ROBAXIN) injection 500 mg  500 mg Intravenous Q6H PRN Andris Baumann, MD       metoCLOPramide (REGLAN) tablet 5-10 mg  5-10 mg Oral Q8H PRN Reinaldo Berber, MD       Or   metoCLOPramide (REGLAN) injection 5-10 mg  5-10 mg Intravenous Q8H PRN Reinaldo Berber, MD       morphine (PF)  2 MG/ML injection 2 mg  2 mg Intravenous Q3H PRN Lolita Patella B, MD   2 mg at 07/16/23 0757   multivitamin with minerals tablet 1 tablet  1 tablet Per Tube Daily Lolita Patella B, MD   1 tablet at 07/16/23 1191   mupirocin ointment (BACTROBAN) 2 % 1 Application  1 Application Nasal BID Andris Baumann, MD   1 Application at 07/16/23 1247   ondansetron (ZOFRAN) tablet 4 mg  4 mg Oral Q6H PRN Reinaldo Berber, MD       Or   ondansetron (ZOFRAN) injection 4 mg  4 mg Intravenous Q6H PRN Reinaldo Berber, MD       Oral care mouth rinse  15 mL Mouth Rinse PRN Sreenath, Sudheer B, MD       oxyCODONE (Oxy IR/ROXICODONE) immediate release tablet 5-10 mg  5-10 mg Oral Q4H PRN Lolita Patella B, MD   5 mg at 07/16/23 0121     Discharge Medications: Please see discharge summary for a list of discharge medications.  Relevant Imaging Results:  Relevant Lab Results:   Additional Information [id number]  Garret Reddish, RN

## 2023-07-16 NOTE — Progress Notes (Signed)
 PROGRESS NOTE    Barbara Galvan  WUJ:[id number] DOB: 12/17/1932 DOA: 07/14/2023 PCP: Jerl Mina, MD    Brief Narrative:   89 y.o. female with medical history significant for Chronic right hip pain,, HTN being admitted with a left intertrochanteric hip fracture after she fell in the yard.  She had to crawl to the house and that she attempted to get up she again fell forward hitting her head.  She did not lose consciousness.  She was previously in her usual state of health last saw her PCP for an annual wellness visit on 2/4.  Hip x-ray showing left intertrochanteric fracture   The ED provider spoke with orthopedist, Dr. Audelia Acton who will take patient to the OR in the afternoon of 2/19  Patient underwent successful IM nailing of left intertrochanteric hip fracture on 2/19.  Tolerated procedure well.  Assessment & Plan:   Principal Problem:   Displaced intertrochanteric fracture of left femur, initial encounter for closed fracture [facility]) Active Problems:   Hypertension   Hyponatremia   Leukocytosis   Anemia   Malnutrition of moderate degree   Closed left hip fracture, initial encounter (HCC) Accidental fall No evidence of syncopal event.  Orthopedics consulted from ED Status post IM nail 2/19 Plan: Pain control DVT prophylaxis PT/OT Bowel regimen TOC consult, will need skilled nursing facility   Anemia suspect iron deficiency Hemoglobin of 10, down from 12 No reports of prior bleeding Serum iron 31 with decreased saturation ratios indicating possible iron deficiency Plan: Type and screen Once patient has BM we can start daily iron supplementation     Leukocytosis WBC 20K on admission.  No clinical indications of infection. Downtrending, suspect reactive in nature   Hyponatremia Appears chronic and stable.  Continue to monitor   Hypertension Resume home lisinopril and amlodipine As needed labetalol   DVT prophylaxis: SQ lovenox Code Status:  FULL Family Communication: Brother at bedside 2/20 Disposition Plan: Status is: Inpatient Remains inpatient appropriate because: Hip fracture. POD#1   Level of care: Med-Surg  Consultants:  Orthopedics  Procedures:  IM nail  Antimicrobials: None    Subjective: Seen and examined.  Resting in bed.  Pain mild to moderate  Objective: Vitals:   07/15/23 2000 07/15/23 2343 07/16/23 0402 07/16/23 0738  BP: 136/68 129/61 (!) 143/84 (!) 160/75  Pulse: 77 74 91 79  Resp: 17 17 18 18   Temp: 98.1 F (36.7 C) 98.6 F (37 C) 98.9 F (37.2 C) 98.6 F (37 C)  TempSrc: Oral   Oral  SpO2: 99% 99% 96% 98%  Weight:      Height:        Intake/Output Summary (Last 24 hours) at 07/16/2023 1309 Last data filed at 07/16/2023 1049 Gross per 24 hour  Intake 500 ml  Output 840 ml  Net -340 ml   Filed Weights   07/15/23 0635  Weight: 43.1 kg    Examination:  General exam: NAD  Respiratory system: Clear to auscultation. Respiratory effort normal. Cardiovascular system: S1-S2, RRR, no pedal edema, no murmurs Gastrointestinal system: Thin, soft, NT/ND, normal bowel sounds Central nervous system: Alert and oriented. No focal neurological deficits. Extremities: Left hip dressings CDI Skin: No rashes, lesions or ulcers Psychiatry: Judgement and insight appear normal. Mood & affect appropriate.     Data Reviewed: I have personally reviewed following labs and imaging studies  CBC: Recent Labs  Lab 07/14/23 1655 07/15/23 0757 07/16/23 0529  WBC 20.9* 10.5 10.1  NEUTROABS 18.6* 8.3* 7.6  HGB  10.6* 9.8* 8.9*  HCT 30.7* 28.4* 26.4*  MCV 84.1 84.8 86.0  PLT 337 302 291   Basic Metabolic Panel: Recent Labs  Lab 07/14/23 1655 07/15/23 0757 07/16/23 0529  NA 127* 129* 130*  K 4.0 3.8 3.9  CL 91* 95* 96*  CO2 24 26 28   GLUCOSE 168* 91 99  BUN 21 12 10   CREATININE 0.90 0.70 0.78  CALCIUM 8.7* 8.4* 8.3*   GFR: Estimated Creatinine Clearance: 33.1 mL/min (by C-G formula  based on SCr of 0.78 mg/dL). Liver Function Tests: Recent Labs  Lab 07/14/23 1655  AST 27  ALT 25  ALKPHOS 61  BILITOT 0.8  PROT 6.3*  ALBUMIN 3.7   No results for input(s): "LIPASE", "AMYLASE" in the last 168 hours. No results for input(s): "AMMONIA" in the last 168 hours. Coagulation Profile: No results for input(s): "INR", "PROTIME" in the last 168 hours. Cardiac Enzymes: No results for input(s): "CKTOTAL", "CKMB", "CKMBINDEX", "TROPONINI" in the last 168 hours. BNP (last 3 results) No results for input(s): "PROBNP" in the last 8760 hours. HbA1C: No results for input(s): "HGBA1C" in the last 72 hours. CBG: No results for input(s): "GLUCAP" in the last 168 hours. Lipid Profile: No results for input(s): "CHOL", "HDL", "LDLCALC", "TRIG", "CHOLHDL", "LDLDIRECT" in the last 72 hours. Thyroid Function Tests: No results for input(s): "TSH", "T4TOTAL", "FREET4", "T3FREE", "THYROIDAB" in the last 72 hours. Anemia Panel: Recent Labs    07/16/23 0529  FOLATE 30.0  FERRITIN 27  TIBC 311  IRON 31   Sepsis Labs: No results for input(s): "PROCALCITON", "LATICACIDVEN" in the last 168 hours.  Recent Results (from the past 240 hours)  Surgical PCR screen     Status: None   Collection Time: 07/15/23  6:40 AM   Specimen: Nasal Mucosa; Nasal Swab  Result Value Ref Range Status   MRSA, PCR NEGATIVE NEGATIVE Final   Staphylococcus aureus NEGATIVE NEGATIVE Final    Comment: (NOTE) The Xpert SA Assay (FDA approved for NASAL specimens in patients 2 years of age and older), is one component of a comprehensive surveillance program. It is not intended to diagnose infection nor to guide or monitor treatment. Performed at [facility], [address] [place], [place] [address]          Radiology Studies: DG HIP UNILAT WITH PELVIS 2-3 VIEWS LEFT Result Date: 07/15/2023 CLINICAL DATA:  Elective surgery. EXAM: DG HIP (WITH OR WITHOUT PELVIS) 2-3V LEFT COMPARISON:   Preoperative imaging. FINDINGS: Five fluoroscopic spot views of the left hip submitted from the operating room. Femoral intramedullary nail with trans trochanteric and distal locking screw fixation traverse proximal femur fracture. Fluoroscopy time 1 minutes 42 seconds. Dose 14.4 mGy. IMPRESSION: Intraoperative fluoroscopy during proximal femur fracture fixation. Electronically Signed   By: Narda Rutherford M.D.   On: 07/15/2023 14:50   DG C-Arm 1-60 Min-No Report Result Date: 07/15/2023 Fluoroscopy was utilized by the requesting physician.  No radiographic interpretation.   DG Femur Portable Min 2 Views Left Result Date: 07/14/2023 CLINICAL DATA:  Patient with known left intertrochanteric femoral fracture. Full length femur images requested by ordering provider. EXAM: LEFT FEMUR PORTABLE 2 VIEWS COMPARISON:  Left hip radiographs dated 07/14/2023 5:20 p.m. FINDINGS: A displaced and comminuted intertrochanteric fracture of the left proximal femur is again noted. No additional fracture identified. IMPRESSION: Redemonstrated displaced and comminuted intertrochanteric fracture of the left proximal femur. No additional fracture identified. Electronically Signed   By: Hart Robinsons M.D.   On: 07/14/2023 21:09  DG Chest 1 View Result Date: 07/14/2023 CLINICAL DATA:  Fall, initial encounter, left hip fracture. EXAM: CHEST  1 VIEW COMPARISON:  None Available. FINDINGS: The cardiomediastinal contours are normal. Aortic atherosclerosis. Pulmonary vasculature is normal. No consolidation, pleural effusion, or pneumothorax. No acute osseous abnormalities are seen. IMPRESSION: No active disease. Electronically Signed   By: Narda Rutherford M.D.   On: 07/14/2023 18:53   DG Hip Unilat W or Wo Pelvis 2-3 Views Left Result Date: 07/14/2023 CLINICAL DATA:  Status post fall with pain, shortening and rotation. EXAM: DG HIP (WITH OR WITHOUT PELVIS) 2-3V LEFT COMPARISON:  None Available. FINDINGS: Displaced and comminuted  intertrochanteric left proximal femur fracture. Apex lateral angulation with mild proximal migration of the femoral shaft. The femoral head is seated, no hip dislocation. 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 Soft tissue edema is seen lateral to the fracture site. IMPRESSION: Displaced and comminuted intertrochanteric left proximal femur fracture. Electronically Signed   By: Narda Rutherford M.D.   On: 07/14/2023 18:53   CT Head Wo Contrast Result Date: 07/14/2023 CLINICAL DATA:  Fall with trauma to the head and neck EXAM: CT HEAD WITHOUT CONTRAST CT CERVICAL SPINE WITHOUT CONTRAST TECHNIQUE: Multidetector CT imaging of the head and cervical spine was performed following the standard protocol without intravenous contrast. Multiplanar CT image reconstructions of the cervical spine were also generated. RADIATION DOSE REDUCTION: This exam was performed according to the departmental dose-optimization program which includes automated exposure control, adjustment of the mA and/or kV according to patient size and/or use of iterative reconstruction technique. COMPARISON:  None Available. FINDINGS: CT HEAD FINDINGS Brain: Age related volume loss without subjective lobar predominance. Chronic small-vessel ischemic changes of the cerebral hemispheric white matter without evidence of acute supra tentorial stroke. Benign calcification within the right pons which could be an insignificant idiopathic calcification or could relate to a small cavernoma. Neither should be of clinical relevance. No evidence of acute hemorrhage, mass, hydrocephalus or extra-axial collection. Vascular: There is atherosclerotic calcification of the major vessels at the base of the brain. Skull: Negative Sinuses/Orbits: Clear/normal Other: None CT CERVICAL SPINE FINDINGS Alignment: Mild scoliosis. Skull base and vertebrae: No regional fracture or focal destructive lesion. Minimal depression of the superior endplates of T2 and T3, favored  to be old. Soft tissues and spinal canal: No traumatic soft tissue finding. No visible mass or adenopathy. Disc levels: Chronic degenerative spondylosis from C3-4 through C6-7. Mild osteophytic encroachment upon the canal or foramina. No severe or advanced disease. Upper chest: Scarring at the lung apices. Other: None IMPRESSION: HEAD CT: No acute or traumatic finding. Age related volume loss and chronic small-vessel ischemic changes of the white matter. CERVICAL SPINE CT: No acute or traumatic finding. Chronic degenerative spondylosis from C3-4 through C6-7. Minimal depression of the superior endplates of T2 and T3, favored to be old. Electronically Signed   By: Paulina Fusi M.D.   On: 07/14/2023 17:34   CT Cervical Spine Wo Contrast Result Date: 07/14/2023 CLINICAL DATA:  Fall with trauma to the head and neck EXAM: CT HEAD WITHOUT CONTRAST CT CERVICAL SPINE WITHOUT CONTRAST TECHNIQUE: Multidetector CT imaging of the head and cervical spine was performed following the standard protocol without intravenous contrast. Multiplanar CT image reconstructions of the cervical spine were also generated. RADIATION DOSE REDUCTION: This exam was performed according to the departmental dose-optimization program which includes automated exposure control, adjustment of the mA and/or kV according to patient size and/or use of iterative reconstruction technique. COMPARISON:  None  Available. FINDINGS: CT HEAD FINDINGS Brain: Age related volume loss without subjective lobar predominance. Chronic small-vessel ischemic changes of the cerebral hemispheric white matter without evidence of acute supra tentorial stroke. Benign calcification within the right pons which could be an insignificant idiopathic calcification or could relate to a small cavernoma. Neither should be of clinical relevance. No evidence of acute hemorrhage, mass, hydrocephalus or extra-axial collection. Vascular: There is atherosclerotic calcification of the major  vessels at the base of the brain. Skull: Negative Sinuses/Orbits: Clear/normal Other: None CT CERVICAL SPINE FINDINGS Alignment: Mild scoliosis. Skull base and vertebrae: No regional fracture or focal destructive lesion. Minimal depression of the superior endplates of T2 and T3, favored to be old. Soft tissues and spinal canal: No traumatic soft tissue finding. No visible mass or adenopathy. Disc levels: Chronic degenerative spondylosis from C3-4 through C6-7. Mild osteophytic encroachment upon the canal or foramina. No severe or advanced disease. Upper chest: Scarring at the lung apices. Other: None IMPRESSION: HEAD CT: No acute or traumatic finding. Age related volume loss and chronic small-vessel ischemic changes of the white matter. CERVICAL SPINE CT: No acute or traumatic finding. Chronic degenerative spondylosis from C3-4 through C6-7. Minimal depression of the superior endplates of T2 and T3, favored to be old. Electronically Signed   By: Paulina Fusi M.D.   On: 07/14/2023 17:34        Scheduled Meds:  acetaminophen  500 mg Oral Q6H   citalopram  20 mg Oral Daily   dicyclomine  20 mg Oral Q6H   docusate sodium  100 mg Oral BID   enoxaparin (LOVENOX) injection  40 mg Subcutaneous Q24H   feeding supplement  237 mL Per Tube BID BM   multivitamin with minerals  1 tablet Per Tube Daily   mupirocin ointment  1 Application Nasal BID   Continuous Infusions:     LOS: 2 days      Tresa Moore, MD [facility]   If 7PM-7AM, please contact night-coverage  07/16/2023, 1:09 PM

## 2023-07-16 NOTE — Care Management Important Message (Signed)
 Important Message  Patient Details  Name: Barbara Galvan MRN: [id number] Date of Birth: May 26, 1933   Important Message Given:  Yes - Medicare IM     Cristela Blue, CMA 07/16/2023, 10:34 AM

## 2023-07-16 NOTE — Plan of Care (Signed)

## 2023-07-16 NOTE — Evaluation (Signed)
 Physical Therapy Evaluation Patient Details Name: Barbara Galvan MRN: [id number] DOB: [birth year] Today's Date: 07/16/2023  History of Present Illness  Pt is an 89 yo female s/p L IM nailing. PMH of HTN.  Clinical Impression  Pt alert, agreeable to PT, oriented to self, family in room, situation. Stated at baseline she lives alone and is independent.   The pt was able to initiate supine to sit but needed modAx2, fair sitting balance at EOB and on BSC once repositioned in midline. Stand pivot with handheld assist modA, unable to void. Sit <> Stand with RW and minA for steadying, and pt able to take 1-2 small steps forwards but due to pain unable to finish ambulating to recliner, required modA for off weighting LLE. Pt did also attempt to leave RW outside BOS and reach for chair arm too soon.  Overall the patient demonstrated deficits (see "PT Problem List") that impede the patient's functional abilities, safety, and mobility and would benefit from skilled PT intervention.          If plan is discharge home, recommend the following: A lot of help with walking and/or transfers;A lot of help with bathing/dressing/bathroom;Assistance with feeding;Assist for transportation;Assistance with cooking/housework;Help with stairs or ramp for entrance   Can travel by private vehicle   No    Equipment Recommendations Other (comment) (TBD at next venue of care)  Recommendations for Other Services       Functional Status Assessment Patient has had a recent decline in their functional status and demonstrates the ability to make significant improvements in function in a reasonable and predictable amount of time.     Precautions / Restrictions Precautions Precautions: Fall Recall of Precautions/Restrictions: Intact Restrictions Weight Bearing Restrictions Per Provider Order: Yes LLE Weight Bearing Per Provider Order: Weight bearing as tolerated      Mobility  Bed Mobility Overal bed mobility:  Needs Assistance Bed Mobility: Supine to Sit     Supine to sit: Min assist, Mod assist, +2 for physical assistance          Transfers Overall transfer level: Needs assistance Equipment used: Rolling walker (2 wheels) Transfers: Sit to/from Stand, Bed to chair/wheelchair/BSC Sit to Stand: Min assist Stand pivot transfers: Mod assist         General transfer comment: not able to successfully complete until modA provided to transition to recliner. pt also attempted to leave RW outside BOS    Ambulation/Gait Ambulation/Gait assistance: Contact guard assist, Mod assist Gait Distance (Feet): 3 Feet           General Gait Details: not able to successfully complete until modA provided to transition to recliner. pt also attempted to leave RW outside BOS  Stairs            Wheelchair Mobility     Tilt Bed    Modified Rankin (Stroke Patients Only)       Balance Overall balance assessment: Needs assistance Sitting-balance support: Feet supported, Bilateral upper extremity supported Sitting balance-Leahy Scale: Poor     Standing balance support: Bilateral upper extremity supported Standing balance-Leahy Scale: Poor                               Pertinent Vitals/Pain Pain Assessment Pain Assessment: Faces Faces Pain Scale: Hurts even more Pain Location: L hip with mobility Pain Descriptors / Indicators: Sore, Discomfort, Grimacing, Guarding, Moaning Pain Intervention(s): Limited activity within patient's tolerance, Monitored during session, Repositioned  Home Living Family/patient expects to be discharged to:: Private residence Living Arrangements: Alone Available Help at Discharge: Family Type of Home: House Home Access: Stairs to enter Entrance Stairs-Rails: Doctor, general practice of Steps: 2   Home Layout: One level Home Equipment: Rollator (4 wheels);Grab bars - toilet;Grab bars - tub/shower      Prior Function Prior  Level of Function : Independent/Modified Independent                     Extremity/Trunk Assessment   Upper Extremity Assessment Upper Extremity Assessment: Defer to OT evaluation    Lower Extremity Assessment Lower Extremity Assessment: Generalized weakness (unable to lift LLE against gravity without assistance)       Communication   Communication Factors Affecting Communication: Hearing impaired    Cognition Arousal: Alert Behavior During Therapy: WFL for tasks assessed/performed   PT - Cognitive impairments: No apparent impairments                                 Cueing       General Comments      Exercises     Assessment/Plan    PT Assessment Patient needs continued PT services  PT Problem List Decreased strength;Pain;Decreased range of motion;Decreased activity tolerance;Decreased knowledge of use of DME;Decreased balance;Decreased mobility;Decreased knowledge of precautions       PT Treatment Interventions DME instruction;Balance training;Gait training;Neuromuscular re-education;Stair training;Functional mobility training;Patient/family education;Therapeutic activities;Therapeutic exercise    PT Goals (Current goals can be found in the Care Plan section)  Acute Rehab PT Goals Patient Stated Goal: to get back to normal PT Goal Formulation: With patient Time For Goal Achievement: 07/30/23 Potential to Achieve Goals: Good    Frequency 7X/week     Co-evaluation PT/OT/SLP Co-Evaluation/Treatment: Yes Reason for Co-Treatment: To address functional/ADL transfers;For patient/therapist safety PT goals addressed during session: Mobility/safety with mobility;Balance;Proper use of DME OT goals addressed during session: ADL's and self-care;Proper use of Adaptive equipment and DME       AM-PAC PT "6 Clicks" Mobility  Outcome Measure Help needed turning from your back to your side while in a flat bed without using bedrails?: A Lot Help  needed moving from lying on your back to sitting on the side of a flat bed without using bedrails?: A Lot Help needed moving to and from a bed to a chair (including a wheelchair)?: A Lot Help needed standing up from a chair using your arms (e.g., wheelchair or bedside chair)?: A Lot Help needed to walk in hospital room?: A Lot Help needed climbing 3-5 steps with a railing? : Total 6 Click Score: 11    End of Session Equipment Utilized During Treatment: Gait belt Activity Tolerance: Patient tolerated treatment well Patient left: in chair;with call bell/phone within reach;with chair alarm set Nurse Communication: Mobility status PT Visit Diagnosis: Other abnormalities of gait and mobility (R26.89);Difficulty in walking, not elsewhere classified (R26.2);Muscle weakness (generalized) (M62.81);Pain Pain - Right/Left: Left Pain - part of body: Hip    Time: 7846-9629 PT Time Calculation (min) (ACUTE ONLY): 20 min   Charges:   PT Evaluation $PT Eval Low Complexity: 1 Low   PT General Charges $$ ACUTE PT VISIT: 1 Visit       Olga Coaster PT, DPT 1:11 PM,07/16/23

## 2023-07-17 ENCOUNTER — Encounter: Payer: Self-pay | Admitting: Orthopedic Surgery

## 2023-07-17 DIAGNOSIS — S72142A Displaced intertrochanteric fracture of left femur, initial encounter for closed fracture: Secondary | ICD-10-CM | POA: Diagnosis not present

## 2023-07-17 LAB — CBC
HCT: 25.3 % — ABNORMAL LOW (ref 36.0–46.0)
Hemoglobin: 8.9 g/dL — ABNORMAL LOW (ref 12.0–15.0)
MCH: 29.8 pg (ref 26.0–34.0)
MCHC: 35.2 g/dL (ref 30.0–36.0)
MCV: 84.6 fL (ref 80.0–100.0)
Platelets: 288 10*3/uL (ref 150–400)
RBC: 2.99 MIL/uL — ABNORMAL LOW (ref 3.87–5.11)
RDW: 13.6 % (ref 11.5–15.5)
WBC: 10.3 10*3/uL (ref 4.0–10.5)
nRBC: 0 % (ref 0.0–0.2)

## 2023-07-17 LAB — BASIC METABOLIC PANEL
Anion gap: 8 (ref 5–15)
BUN: 15 mg/dL (ref 8–23)
CO2: 28 mmol/L (ref 22–32)
Calcium: 8.2 mg/dL — ABNORMAL LOW (ref 8.9–10.3)
Chloride: 95 mmol/L — ABNORMAL LOW (ref 98–111)
Creatinine, Ser: 0.68 mg/dL (ref 0.44–1.00)
GFR, Estimated: >60 mL/min (ref >60)
Glucose, Bld: 113 mg/dL — ABNORMAL HIGH (ref 70–99)
Potassium: 3.9 mmol/L (ref 3.5–5.1)
Sodium: 131 mmol/L — ABNORMAL LOW (ref 135–145)

## 2023-07-17 MED ORDER — ADULT MULTIVITAMIN W/MINERALS CH
1.0000 | ORAL_TABLET | Freq: Every day | ORAL | Status: DC
  Administered 2023-07-18: 1 via ORAL
  Filled 2023-07-17: qty 1

## 2023-07-17 MED ORDER — FERROUS SULFATE 325 (65 FE) MG PO TABS
325.0000 mg | ORAL_TABLET | Freq: Every day | ORAL | Status: DC
  Administered 2023-07-18: 325 mg via ORAL
  Filled 2023-07-17: qty 1

## 2023-07-17 MED ORDER — TAMSULOSIN HCL 0.4 MG PO CAPS: 0.4000 mg | ORAL_CAPSULE | Freq: Every day | ORAL | Status: DC

## 2023-07-17 NOTE — TOC Progression Note (Signed)
 Transition of Care [facility]) - Progression Note    Patient Details  Name: Barbara Galvan MRN: [id number] Date of Birth: 25-Jun-1932  [facility] CM/SW Contact  Garret Reddish, RN Phone Number: 07/17/2023, 10:24 AM  Clinical Narrative:    Chart reviewed.  I have received call from Admissions Coordinator Sue Lush at [facility].  She informs me that she will be able to accept patient.  I have informed Sue Lush that patient will need one more night stay per Medicare guidelines.  Sue Lush informs me that she will be able to accept patient on Saturday.  She has asked that the Case Manager call her cell phone number on Saturday.    I have informed patient's brother Mr. Charm Barges that patient will be able to discharge to [facility] on Saturday.  I have also made provider aware.    TOC will continue to follow for discharge planning.     Expected Discharge Plan: Skilled Nursing Facility Barriers to Discharge: No Barriers Identified  Expected Discharge Plan and Services     Post Acute Care Choice:  (STR) Living arrangements for the past 2 months: Single Family Home                                       Social Determinants of Health (SDOH) Interventions SDOH Screenings   Food Insecurity: No Food Insecurity (07/14/2023)  Housing: Low Risk  (07/14/2023)  Transportation Needs: No Transportation Needs (07/15/2023)  Utilities: Not At Risk (07/14/2023)  Financial Resource Strain: Low Risk  (06/30/2023)   Received from [facility]  Social Connections: Moderately Integrated (07/14/2023)  Tobacco Use: Medium Risk (07/15/2023)    Readmission Risk Interventions     No data to display

## 2023-07-17 NOTE — Plan of Care (Signed)
  Problem: Clinical Measurements: Goal: Ability to maintain clinical measurements within normal limits will improve Outcome: Progressing   Problem: Clinical Measurements: Goal: Will remain free from infection Outcome: Progressing   Problem: Clinical Measurements: Goal: Diagnostic test results will improve Outcome: Progressing   Problem: Activity: Goal: Risk for activity intolerance will decrease Outcome: Progressing   Problem: Pain Managment: Goal: General experience of comfort will improve and/or be controlled Outcome: Progressing

## 2023-07-17 NOTE — Progress Notes (Signed)
   Subjective: 2 Days Post-Op Procedure(s) (LRB): INTRAMEDULLARY (IM) NAIL INTERTROCHANTERIC (Left) Patient reports pain as mild.   Patient is well, and has had no acute complaints or problems Denies any CP, SOB, ABD pain. + BM We will continue therapy today.  Plan is to go Skilled nursing facility after hospital stay.  Objective: Vital signs in last 24 hours: Temp:  [97.9 F (36.6 C)] 97.9 F (36.6 C) (02/20 2246) Pulse Rate:  [84-94] 94 (02/20 2246) Resp:  [17] 17 (02/20 2246) BP: (107-136)/(59-68) 136/68 (02/20 2246) SpO2:  [94 %-99 %] 94 % (02/20 2246)  Intake/Output from previous day: No intake/output data recorded. Intake/Output this shift: No intake/output data recorded.  Recent Labs    07/14/23 1655 07/15/23 0757 07/16/23 0529 07/17/23 0553  HGB 10.6* 9.8* 8.9* 8.9*   Recent Labs    07/16/23 0529 07/17/23 0553  WBC 10.1 10.3  RBC 3.07* 2.99*  HCT 26.4* 25.3*  PLT 291 288   Recent Labs    07/16/23 0529 07/17/23 0553  NA 130* 131*  K 3.9 3.9  CL 96* 95*  CO2 28 28  BUN 10 15  CREATININE 0.78 0.68  GLUCOSE 99 113*  CALCIUM 8.3* 8.2*   No results for input(s): "LABPT", "INR" in the last 72 hours.  EXAM General - Patient is Alert, Appropriate, and Oriented Extremity - Neurovascular intact Sensation intact distally Intact pulses distally Dorsiflexion/Plantar flexion intact No cellulitis present Compartment soft Dressing - dressing C/D/I and no drainage Motor Function - intact, moving foot and toes well on exam.   Past Medical History:  Diagnosis Date   Arthritis    Elevated lipids    Encounter for diagnostic colonoscopy due to change in bowel habits    Hypertension    Scoliosis     Assessment/Plan:   2 Days Post-Op Procedure(s) (LRB): INTRAMEDULLARY (IM) NAIL INTERTROCHANTERIC (Left) Principal Problem:   Displaced intertrochanteric fracture of left femur, initial encounter for closed fracture (HCC) Active Problems:   Hypertension    Hyponatremia   Leukocytosis   Anemia   Malnutrition of moderate degree  Estimated body mass index is 19.19 kg/m as calculated from the following:   Height as of this encounter: 4\' 11"  (1.499 m).   Weight as of this encounter: 43.1 kg. Advance diet Up with therapy Pain well-controlled Vital signs are stable Acute postop blood loss anemia -hemoglobin 8.9. stable. Recheck labs tomorrow Care management to assist with discharge to skilled nursing facility, [facility]  Patient will need 2-week follow-up with [facility] orthopedics Lovenox 40 mg subcu daily x 14 days at discharge  DVT Prophylaxis - Lovenox, TED hose, and SCDs Weight-Bearing as tolerated to left leg   T. Cranston Neighbor, PA-C [facility] 07/17/2023, 8:37 AM

## 2023-07-17 NOTE — Progress Notes (Signed)
 Occupational Therapy Treatment Patient Details Name: Barbara Galvan MRN: [id number] DOB: 1932-12-17 Today's Date: 07/17/2023   History of present illness Pt is an 89 yo female s/p L IM nailing. PMH of HTN.   OT comments  Pt received seated in recliner; has finished breakfast. Appearing alert; willing to work with OT on grooming at sink; OT wheeled pt in recliner to in front of the sink. T/f CGA with countertop support sit to stand; pt able to stand x4 minutes with CGA for safety while grooming at sink. See flowsheet below for further details of session. Left in recliner with BIL LE elevated with all needs in reach.  Patient will benefit from continued OT while in acute care.       If plan is discharge home, recommend the following:  A lot of help with walking and/or transfers;A lot of help with bathing/dressing/bathroom;Help with stairs or ramp for entrance   Equipment Recommendations  BSC/3in1    Recommendations for Other Services      Precautions / Restrictions Precautions Precautions: Fall Recall of Precautions/Restrictions: Intact Restrictions Weight Bearing Restrictions Per Provider Order: Yes LLE Weight Bearing Per Provider Order: Weight bearing as tolerated       Mobility Bed Mobility               General bed mobility comments: Not tested; pt received in chair and left in chair.    Transfers Overall transfer level: Needs assistance Equipment used:  (using sink countertop) Transfers: Sit to/from Stand Sit to Stand: Contact guard assist           General transfer comment: Pt performed STS transfer from chair to standing at sink, used countertop for support. OT providing CGA during t/f for safety. Pain when transitioning footrest of recliner up or down.     Balance Overall balance assessment: Needs assistance Sitting-balance support: Feet supported, Bilateral upper extremity supported Sitting balance-Leahy Scale: Good     Standing balance  support: Single extremity supported Standing balance-Leahy Scale: Fair Standing balance comment: stood at sink with unilateral support while brushing teeth; intermittently had no UE support while wringing out washcloth.                           ADL either performed or assessed with clinical judgement   ADL Overall ADL's : Needs assistance/impaired     Grooming: Oral care;Wash/dry face;Wash/dry hands;Brushing hair;Standing;Contact guard assist Grooming Details (indicate cue type and reason): CGA at sink in bathroom; pt intermittently needing UE support on countertop for balance. Stood for total of 4 minutes at sink.                                    Extremity/Trunk Assessment Upper Extremity Assessment Upper Extremity Assessment: Generalized weakness;Overall [place] for tasks assessed   Lower Extremity Assessment Lower Extremity Assessment: Overall WFL for tasks assessed;Generalized weakness        Vision   Additional Comments: patient wears glasses. Today she is wearing glasses and states that these are her old glasses, as her newer ones got broken when she fell; states she doesn't see as well with her older glasses.   Perception     Praxis     Communication Communication Communication: No apparent difficulties Factors Affecting Communication: Hearing impaired   Cognition Arousal: Alert Behavior During Therapy: WFL for tasks assessed/performed Cognition: No apparent impairments  OT - Cognition Comments: Very kind and very motivated. Follows all cues.                 Following commands: Intact        Cueing   Cueing Techniques: Verbal cues  Exercises      Shoulder Instructions       General Comments On room air. VSS throughout session. Very motivated.    Pertinent Vitals/ Pain       Pain Assessment Pain Assessment: 0-10 Pain Score: 1  Pain Location: L hip with mobility Pain Descriptors / Indicators:  Discomfort Pain Intervention(s): Limited activity within patient's tolerance, Repositioned  Home Living                                          Prior Functioning/Environment              Frequency  Min 1X/week        Progress Toward Goals  OT Goals(current goals can now be found in the care plan section)  Progress towards OT goals: Progressing toward goals  Acute Rehab OT Goals Patient Stated Goal: Get better and go home. OT Goal Formulation: With patient Time For Goal Achievement: 07/30/23 Potential to Achieve Goals: Good ADL Goals Pt Will Perform Grooming: with modified independence;sitting Pt Will Perform Lower Body Dressing: with min assist;sit to/from stand;with adaptive equipment Pt Will Transfer to Toilet: ambulating;bedside commode;with contact guard assist  Plan      Co-evaluation        PT goals addressed during session: Mobility/safety with mobility;Balance;Proper use of DME;Strengthening/ROM        AM-PAC OT "6 Clicks" Daily Activity     Outcome Measure   Help from another person eating meals?: None Help from another person taking care of personal grooming?: A Little Help from another person toileting, which includes using toliet, bedpan, or urinal?: A Little Help from another person bathing (including washing, rinsing, drying)?: A Lot Help from another person to put on and taking off regular upper body clothing?: A Little Help from another person to put on and taking off regular lower body clothing?: A Lot 6 Click Score: 17    End of Session    OT Visit Diagnosis: Other abnormalities of gait and mobility (R26.89);Muscle weakness (generalized) (M62.81)   Activity Tolerance Patient tolerated treatment well   Patient Left in chair;with call bell/phone within reach;with chair alarm set   Nurse Communication Mobility status        Time: 1610-9604 OT Time Calculation (min): 22 min  Charges: OT General Charges $OT  Visit: 1 Visit OT Treatments $Self Care/Home Management : 8-22 mins  Linward Foster, MS, OTR/L  Alvester Morin 07/17/2023, 9:55 AM

## 2023-07-17 NOTE — Progress Notes (Signed)
 Physical Therapy Treatment Patient Details Name: Barbara Galvan MRN: [id number] DOB: 09-09-1932 Today's Date: 07/17/2023   History of Present Illness Pt is an 89 yo female s/p L IM nailing. PMH of HTN.    PT Comments  Pt was long sitting in bed upon arrival. She is alert, oriented, and extremely pleasant. Pt is agreeable to session and remained motivated throughout. " I want to get better so I can go home soon." Pt demonstrated improved pain tolerance and overall mobility. She was safely able to exit bed, stand to RW, and tolerate ambulation ~ 8 ft with chair follow. No buckling noted but antalgic step to gait sequencing overall. Reviewed importance of exercises even when in sitting/bed. She states understanding. Author assisted with placing breakfast order prior to RN tech arriving to assist pt to [facility]. Pt is progressing well. Recommend continued skilled PT at DC to maximize independence and safety with all ADLs. DC recs remain appropriate.     If plan is discharge home, recommend the following: A little help with walking and/or transfers;A lot of help with bathing/dressing/bathroom;Assistance with cooking/housework;Direct supervision/assist for medications management;Direct supervision/assist for financial management;Help with stairs or ramp for entrance;Assist for transportation     Equipment Recommendations  Other (comment) (defer to next level of care)       Precautions / Restrictions Precautions Precautions: Fall Recall of Precautions/Restrictions: Intact Restrictions Weight Bearing Restrictions Per Provider Order: Yes LLE Weight Bearing Per Provider Order: Weight bearing as tolerated     Mobility  Bed Mobility Overal bed mobility: Needs Assistance Bed Mobility: Supine to Sit  Supine to sit: Contact guard  General bed mobility comments: pt was able to achieve EOB sitting with CGA only for cueing. Heavy use of bed rails.    Transfers Overall transfer level: Needs  assistance Equipment used: Rolling walker (2 wheels) Transfers: Sit to/from Stand Sit to Stand: Min assist  General transfer comment: pt stood 3 x EOB to RW prior to taking steps    Ambulation/Gait Ambulation/Gait assistance: Contact guard assist, Min assist Gait Distance (Feet): 8 Feet Assistive device: Rolling walker (2 wheels) Gait Pattern/deviations: Step-to pattern, Antalgic Gait velocity: decreased  General Gait Details: pt was able to ambulate ~ 8 ft with recliner follow for additional safety. pt has antalgic gait with occasional min assist to prevent LOB. " pain is better today."    Balance Overall balance assessment: Needs assistance Sitting-balance support: Feet supported, Bilateral upper extremity supported Sitting balance-Leahy Scale: Good Sitting balance - Comments: sat EOB without LOB   Standing balance support: Bilateral upper extremity supported, Reliant on assistive device for balance Standing balance-Leahy Scale: Fair Standing balance comment: reliant on RW. poor without UE support     Communication Communication Communication: No apparent difficulties  Cognition Arousal: Alert Behavior During Therapy: WFL for tasks assessed/performed   PT - Cognitive impairments: No apparent impairments    PT - Cognition Comments: pt is A and O. Agreeable and extremely pleasant Following commands: Intact      Cueing Cueing Techniques: Verbal cues         Pertinent Vitals/Pain Pain Assessment Pain Assessment: 0-10 Pain Score: 4  Pain Location: L hip with mobility Pain Descriptors / Indicators: Discomfort Pain Intervention(s): Limited activity within patient's tolerance, Monitored during session, Repositioned     PT Goals (current goals can now be found in the care plan section) Acute Rehab PT Goals Patient Stated Goal: rehab then home Progress towards PT goals: Progressing toward goals    Frequency  7X/week       Co-evaluation     PT goals  addressed during session: Mobility/safety with mobility;Balance;Proper use of DME;Strengthening/ROM        AM-PAC PT "6 Clicks" Mobility   Outcome Measure  Help needed turning from your back to your side while in a flat bed without using bedrails?: A Little Help needed moving from lying on your back to sitting on the side of a flat bed without using bedrails?: A Lot Help needed moving to and from a bed to a chair (including a wheelchair)?: A Lot Help needed standing up from a chair using your arms (e.g., wheelchair or bedside chair)?: A Lot Help needed to walk in hospital room?: A Little Help needed climbing 3-5 steps with a railing? : A Lot 6 Click Score: 14    End of Session   Activity Tolerance: Patient tolerated treatment well Patient left: in chair;with call bell/phone within reach;with chair alarm set Nurse Communication: Mobility status PT Visit Diagnosis: Other abnormalities of gait and mobility (R26.89);Difficulty in walking, not elsewhere classified (R26.2);Muscle weakness (generalized) (M62.81);Pain Pain - Right/Left: Left Pain - part of body: Hip     Time: 9604-5409 PT Time Calculation (min) (ACUTE ONLY): 23 min  Charges:    $Gait Training: 8-22 mins $Therapeutic Activity: 8-22 mins PT General Charges $$ ACUTE PT VISIT: 1 Visit                    Jetta Lout PTA 07/17/23, 8:27 AM

## 2023-07-17 NOTE — Progress Notes (Signed)
 PROGRESS NOTE    Barbara Galvan  ZOX:[id number] DOB: Aug 16, 1932 DOA: 07/14/2023 PCP: Jerl Mina, MD    Brief Narrative:   89 y.o. female with medical history significant for Chronic right hip pain,, HTN being admitted with a left intertrochanteric hip fracture after she fell in the yard.  She had to crawl to the house and that she attempted to get up she again fell forward hitting her head.  She did not lose consciousness.  She was previously in her usual state of health last saw her PCP for an annual wellness visit on 2/4.  Hip x-ray showing left intertrochanteric fracture   The ED provider spoke with orthopedist, Dr. Audelia Acton who will take patient to the OR in the afternoon of 2/19  Patient underwent successful IM nailing of left intertrochanteric hip fracture on 2/19.  Tolerated procedure well.  Assessment & Plan:   Principal Problem:   Displaced intertrochanteric fracture of left femur, initial encounter for closed fracture [facility]) Active Problems:   Hypertension   Hyponatremia   Leukocytosis   Anemia   Malnutrition of moderate degree   Closed left hip fracture, initial encounter (HCC) Accidental fall No evidence of syncopal event.  Orthopedics consulted from ED Status post IM nail 2/19 Plan: Pain control DVT prophylaxis, SQ lovenox x 2 weeks Continue PT/OT Bowel regimen,BM reported TOC consult, will need skilled nursing facility Medically stable for discharge   Anemia suspect iron deficiency Hemoglobin of 10, down from 12 No reports of prior bleeding Serum iron 31 with decreased saturation ratios indicating possible iron deficiency Plan: Start PO iron 325 daily     Leukocytosis WBC 20K on admission.  No clinical indications of infection. Downtrending, suspect reactive in nature   Hyponatremia Appears chronic and stable.  Continue to monitor   Hypertension Resume home lisinopril and amlodipine As needed labetalol   DVT prophylaxis: SQ  lovenox Code Status: FULL Family Communication: Brother at bedside 2/20 Disposition Plan: Status is: Inpatient Remains inpatient appropriate because: Hip fracture. POD#2.  Will need SNF.  Medically stable for discharge   Level of care: Med-Surg  Consultants:  Orthopedics  Procedures:  IM nail  Antimicrobials: None    Subjective: Seen and examined.  Sitting up in chair.  Pain mild.  Objective: Vitals:   07/16/23 0738 07/16/23 1900 07/16/23 2246 07/17/23 0901  BP: (!) 160/75 (!) 107/59 136/68 (!) 117/53  Pulse: 79 84 94 79  Resp: 18  17 16   Temp: 98.6 F (37 C)  97.9 F (36.6 C) 98 F (36.7 C)  TempSrc: Oral  Oral   SpO2: 98% 99% 94% 98%  Weight:      Height:        Intake/Output Summary (Last 24 hours) at 07/17/2023 1108 Last data filed at 07/17/2023 1045 Gross per 24 hour  Intake 200 ml  Output --  Net 200 ml   Filed Weights   07/15/23 0635  Weight: 43.1 kg    Examination:  General exam: No acute distress Respiratory system: Lungs clear.  Normal work of breathing.  Room air Cardiovascular system: S1-S2, RRR, no pedal edema, no murmurs Gastrointestinal system: Thin, soft, NT/ND, normal bowel sounds Central nervous system: Alert and oriented. No focal neurological deficits. Extremities: Left hip dressings CDI Skin: No rashes, lesions or ulcers Psychiatry: Judgement and insight appear normal. Mood & affect appropriate.     Data Reviewed: I have personally reviewed following labs and imaging studies  CBC: Recent Labs  Lab 07/14/23 1655 07/15/23 0757 07/16/23  1610 07/17/23 0553  WBC 20.9* 10.5 10.1 10.3  NEUTROABS 18.6* 8.3* 7.6  --   HGB 10.6* 9.8* 8.9* 8.9*  HCT 30.7* 28.4* 26.4* 25.3*  MCV 84.1 84.8 86.0 84.6  PLT 337 302 291 288   Basic Metabolic Panel: Recent Labs  Lab 07/14/23 1655 07/15/23 0757 07/16/23 0529 07/17/23 0553  NA 127* 129* 130* 131*  K 4.0 3.8 3.9 3.9  CL 91* 95* 96* 95*  CO2 24 26 28 28   GLUCOSE 168* 91 99 113*   BUN 21 12 10 15   CREATININE 0.90 0.70 0.78 0.68  CALCIUM 8.7* 8.4* 8.3* 8.2*   GFR: Estimated Creatinine Clearance: 33.1 mL/min (by C-G formula based on SCr of 0.68 mg/dL). Liver Function Tests: Recent Labs  Lab 07/14/23 1655  AST 27  ALT 25  ALKPHOS 61  BILITOT 0.8  PROT 6.3*  ALBUMIN 3.7   No results for input(s): "LIPASE", "AMYLASE" in the last 168 hours. No results for input(s): "AMMONIA" in the last 168 hours. Coagulation Profile: No results for input(s): "INR", "PROTIME" in the last 168 hours. Cardiac Enzymes: No results for input(s): "CKTOTAL", "CKMB", "CKMBINDEX", "TROPONINI" in the last 168 hours. BNP (last 3 results) No results for input(s): "PROBNP" in the last 8760 hours. HbA1C: No results for input(s): "HGBA1C" in the last 72 hours. CBG: No results for input(s): "GLUCAP" in the last 168 hours. Lipid Profile: No results for input(s): "CHOL", "HDL", "LDLCALC", "TRIG", "CHOLHDL", "LDLDIRECT" in the last 72 hours. Thyroid Function Tests: No results for input(s): "TSH", "T4TOTAL", "FREET4", "T3FREE", "THYROIDAB" in the last 72 hours. Anemia Panel: Recent Labs    07/16/23 0529  VITAMINB12 1,176*  FOLATE 30.0  FERRITIN 27  TIBC 311  IRON 31   Sepsis Labs: No results for input(s): "PROCALCITON", "LATICACIDVEN" in the last 168 hours.  Recent Results (from the past 240 hours)  Surgical PCR screen     Status: None   Collection Time: 07/15/23  6:40 AM   Specimen: Nasal Mucosa; Nasal Swab  Result Value Ref Range Status   MRSA, PCR NEGATIVE NEGATIVE Final   Staphylococcus aureus NEGATIVE NEGATIVE Final    Comment: (NOTE) The Xpert SA Assay (FDA approved for NASAL specimens in patients 37 years of age and older), is one component of a comprehensive surveillance program. It is not intended to diagnose infection nor to guide or monitor treatment. Performed at [facility], [address], [place], [place] [address]          Radiology  Studies: DG HIP UNILAT WITH PELVIS 2-3 VIEWS LEFT Result Date: 07/15/2023 CLINICAL DATA:  Elective surgery. EXAM: DG HIP (WITH OR WITHOUT PELVIS) 2-3V LEFT COMPARISON:  Preoperative imaging. FINDINGS: Five fluoroscopic spot views of the left hip submitted from the operating room. Femoral intramedullary nail with trans trochanteric and distal locking screw fixation traverse proximal femur fracture. Fluoroscopy time 1 minutes 42 seconds. Dose 14.4 mGy. IMPRESSION: Intraoperative fluoroscopy during proximal femur fracture fixation. Electronically Signed   By: Narda Rutherford M.D.   On: 07/15/2023 14:50   DG C-Arm 1-60 Min-No Report Result Date: 07/15/2023 Fluoroscopy was utilized by the requesting physician.  No radiographic interpretation.        Scheduled Meds:  amLODipine  10 mg Oral Daily   citalopram  20 mg Oral Daily   dicyclomine  20 mg Oral Q6H   docusate sodium  100 mg Oral BID   enoxaparin (LOVENOX) injection  40 mg Subcutaneous Q24H   feeding supplement  237 mL Per  Tube BID BM   lisinopril  10 mg Oral Daily   [START ON 07/18/2023] multivitamin with minerals  1 tablet Oral Daily   mupirocin ointment  1 Application Nasal BID   Continuous Infusions:     LOS: 3 days      Tresa Moore, MD [facility]   If 7PM-7AM, please contact night-coverage  07/17/2023, 11:08 AM

## 2023-07-18 DIAGNOSIS — S72142A Displaced intertrochanteric fracture of left femur, initial encounter for closed fracture: Secondary | ICD-10-CM | POA: Diagnosis not present

## 2023-07-18 LAB — CBC
HCT: 24.7 % — ABNORMAL LOW (ref 36.0–46.0)
Hemoglobin: 8.3 g/dL — ABNORMAL LOW (ref 12.0–15.0)
MCH: 28.9 pg (ref 26.0–34.0)
MCHC: 33.6 g/dL (ref 30.0–36.0)
MCV: 86.1 fL (ref 80.0–100.0)
Platelets: 328 10*3/uL (ref 150–400)
RBC: 2.87 MIL/uL — ABNORMAL LOW (ref 3.87–5.11)
RDW: 13.9 % (ref 11.5–15.5)
WBC: 10 10*3/uL (ref 4.0–10.5)
nRBC: 0 % (ref 0.0–0.2)

## 2023-07-18 LAB — BASIC METABOLIC PANEL
Anion gap: 7 (ref 5–15)
BUN: 21 mg/dL (ref 8–23)
CO2: 28 mmol/L (ref 22–32)
Calcium: 8.2 mg/dL — ABNORMAL LOW (ref 8.9–10.3)
Chloride: 97 mmol/L — ABNORMAL LOW (ref 98–111)
Creatinine, Ser: 0.76 mg/dL (ref 0.44–1.00)
GFR, Estimated: >60 mL/min (ref >60)
Glucose, Bld: 103 mg/dL — ABNORMAL HIGH (ref 70–99)
Potassium: 3.9 mmol/L (ref 3.5–5.1)
Sodium: 132 mmol/L — ABNORMAL LOW (ref 135–145)

## 2023-07-18 MED ORDER — OXYCODONE HCL 5 MG PO TABS
2.5000 mg | ORAL_TABLET | Freq: Four times a day (QID) | ORAL | 0 refills | Status: DC | PRN
Start: 2023-07-18 — End: 2023-09-14

## 2023-07-18 MED ORDER — ACETAMINOPHEN 325 MG PO TABS
650.0000 mg | ORAL_TABLET | Freq: Four times a day (QID) | ORAL | Status: DC | PRN
Start: 2023-07-18 — End: 2024-04-15

## 2023-07-18 MED ORDER — ENOXAPARIN SODIUM 40 MG/0.4ML IJ SOSY: 40.0000 mg | PREFILLED_SYRINGE | INTRAMUSCULAR | 0 refills | Status: DC

## 2023-07-18 MED ORDER — FERROUS SULFATE 325 (65 FE) MG PO TABS: 325.0000 mg | ORAL_TABLET | Freq: Every day | ORAL | Status: DC

## 2023-07-18 NOTE — Progress Notes (Signed)
   Subjective: 3 Days Post-Op Procedure(s) (LRB): INTRAMEDULLARY (IM) NAIL INTERTROCHANTERIC (Left) Patient reports pain as mild.   Patient is well, and has had no acute complaints or problems Denies any CP, SOB, ABD pain. + BM yesterday We will continue therapy today.  Plan is to go Skilled nursing facility , [facility] today  Objective: Vital signs in last 24 hours: Temp:  [97.8 F (36.6 C)-98.9 F (37.2 C)] 97.8 F (36.6 C) (02/21 2037) Pulse Rate:  [77-86] 86 (02/21 2037) Resp:  [14-17] 14 (02/21 2037) BP: (112-141)/(53-76) 141/76 (02/21 2037) SpO2:  [89 %-98 %] 92 % (02/21 2037)  Intake/Output from previous day: 02/21 0701 - 02/22 0700 In: 200 [P.O.:200] Out: -  Intake/Output this shift: No intake/output data recorded.  Recent Labs    07/16/23 0529 07/17/23 0553 07/18/23 0425  HGB 8.9* 8.9* 8.3*   Recent Labs    07/17/23 0553 07/18/23 0425  WBC 10.3 10.0  RBC 2.99* 2.87*  HCT 25.3* 24.7*  PLT 288 328   Recent Labs    07/17/23 0553 07/18/23 0425  NA 131* 132*  K 3.9 3.9  CL 95* 97*  CO2 28 28  BUN 15 21  CREATININE 0.68 0.76  GLUCOSE 113* 103*  CALCIUM 8.2* 8.2*   No results for input(s): "LABPT", "INR" in the last 72 hours.  EXAM General - Patient is Alert, Appropriate, and Oriented Extremity - Neurovascular intact Sensation intact distally Intact pulses distally Dorsiflexion/Plantar flexion intact No cellulitis present Compartment soft Dressing - dressing C/D/I and no drainage Motor Function - intact, moving foot and toes well on exam.   Past Medical History:  Diagnosis Date   Arthritis    Elevated lipids    Encounter for diagnostic colonoscopy due to change in bowel habits    Hypertension    Scoliosis     Assessment/Plan:   3 Days Post-Op Procedure(s) (LRB): INTRAMEDULLARY (IM) NAIL INTERTROCHANTERIC (Left) Principal Problem:   Displaced intertrochanteric fracture of left femur, initial encounter for closed fracture  (HCC) Active Problems:   Hypertension   Hyponatremia   Leukocytosis   Anemia   Malnutrition of moderate degree  Estimated body mass index is 19.19 kg/m as calculated from the following:   Height as of this encounter: 4\' 11"  (1.499 m).   Weight as of this encounter: 43.1 kg. Advance diet Up with therapy Pain well-controlled Vital signs are stable Acute postop blood loss anemia -hemoglobin 8.3. stable. Continue with Fe supplement Care management to assist with discharge to skilled nursing facility, [facility]  Patient will need 2-week follow-up with [facility] orthopedics Lovenox 40 mg subcu daily x 14 days at discharge Ok to shower with honeycomb dressing. Leave dressing intact until 2 week follow up appt with [facility]  DVT Prophylaxis - Lovenox, TED hose, and SCDs Weight-Bearing as tolerated to left leg   T. Cranston Neighbor, PA-C [facility] 07/18/2023, 8:12 AM

## 2023-07-18 NOTE — Discharge Summary (Signed)
 Physician Discharge Summary  Barbara Galvan ZOX:[id number] DOB: 1932-10-15 DOA: 07/14/2023  PCP: Jerl Mina, MD  Admit date: 07/14/2023 Discharge date: 07/18/2023  Admitted From: Home Disposition:  SNF  Recommendations for Outpatient Follow-up:  Follow up with PCP in 1-2 weeks Follow up orthopedics 2 weeks  Home Health:No  Equipment/Devices:None   Discharge Condition:Stable  CODE STATUS:FULL  Diet recommendation: Reg  Brief/Interim Summary:   89 y.o. female with medical history significant for Chronic right hip pain,, HTN being admitted with a left intertrochanteric hip fracture after she fell in the yard.  She had to crawl to the house and that she attempted to get up she again fell forward hitting her head.  She did not lose consciousness.  She was previously in her usual state of health last saw her PCP for an annual wellness visit on 2/4.  Hip x-ray showing left intertrochanteric fracture   The ED provider spoke with orthopedist, Dr. Audelia Acton who will take patient to the OR in the afternoon of 2/19   Patient underwent successful IM nailing of left intertrochanteric hip fracture on 2/19.  Tolerated procedure well.   Accepted to [facility].  Discharged in stable condition 2/22.  FU OP ortho 2 weeks.    Discharge Diagnoses:  Principal Problem:   Displaced intertrochanteric fracture of left femur, initial encounter for closed fracture [facility]) Active Problems:   Hypertension   Hyponatremia   Leukocytosis   Anemia   Malnutrition of moderate degree  Closed left hip fracture, initial encounter (HCC) Accidental fall No evidence of syncopal event.  Orthopedics consulted from ED Status post IM nail 2/19 Plan: Pain control DVT prophylaxis, SQ lovenox x 2 weeks Continue PT/OT post dc Bowel regimen,BM reported Discharge to TL 2/22     Anemia suspect iron deficiency Hemoglobin of 10, down from 12 No reports of prior bleeding Serum iron 31 with decreased  saturation ratios indicating possible iron deficiency Plan: Start PO iron 325 daily FU OP PCP   Leukocytosis WBC 20K on admission.  No clinical indications of infection. Downtrending, suspect reactive in nature   Hyponatremia Appears chronic and stable.  Continue to monitor   Hypertension Resume home lisinopril and amlodipine   Discharge Instructions  Discharge Instructions     Diet - low sodium heart healthy   Complete by: As directed    Increase activity slowly   Complete by: As directed       Allergies as of 07/18/2023   No Known Allergies      Medication List     STOP taking these medications    ergocalciferol 1.25 MG (50000 UT) capsule Commonly known as: VITAMIN D2   HYDROcodone-acetaminophen 5-325 MG tablet Commonly known as: NORCO/VICODIN       TAKE these medications    acetaminophen 325 MG tablet Commonly known as: TYLENOL Take 2 tablets (650 mg total) by mouth every 6 (six) hours as needed for mild pain (pain score 1-3), fever or headache.   alendronate 70 MG tablet Commonly known as: FOSAMAX Take 70 mg by mouth once a week. Take with a full glass of water on an empty stomach.   amLODipine 10 MG tablet Commonly known as: NORVASC Take 10 mg by mouth daily.   aspirin EC 81 MG tablet Take 81 mg by mouth daily.   BioGaia Probiotic Liqd Take by mouth daily at 8 pm.   calcium carbonate 500 MG chewable tablet Commonly known as: TUMS - dosed in mg elemental calcium Chew 1 tablet  by mouth daily.   cholecalciferol 25 MCG (1000 UNIT) tablet Commonly known as: VITAMIN D3 Take 1,000 Units by mouth daily.   citalopram 20 MG tablet Commonly known as: CELEXA Take 20 mg by mouth daily.   dicyclomine 20 MG tablet Commonly known as: BENTYL Take 20 mg by mouth every 6 (six) hours.   enoxaparin 40 MG/0.4ML injection Commonly known as: LOVENOX Inject 0.4 mLs (40 mg total) into the skin daily for 14 days.   FISH OIL CONCENTRATE PO Take by  mouth.   lisinopril 10 MG tablet Commonly known as: ZESTRIL Take 10 mg by mouth daily.   methocarbamol 500 MG tablet Commonly known as: ROBAXIN Take 250-500 mg by mouth 2 (two) times daily as needed.   multivitamin-lutein Caps capsule Take 2 capsules by mouth daily.   oxyCODONE 5 MG immediate release tablet Commonly known as: Oxy IR/ROXICODONE Take 0.5-1 tablets (2.5-5 mg total) by mouth every 6 (six) hours as needed for moderate pain (pain score 4-6) or severe pain (pain score 7-10).   vitamin E 1000 UNIT capsule Take 1,000 Units by mouth daily.        Follow-up Information     Evon Slack, PA-C Follow up in 2 week(s).   Specialties: Orthopedic Surgery, Emergency Medicine Contact information: [address] [place] [address] [phone]                No Known Allergies  Consultations: Orthopedics   Procedures/Studies: DG HIP UNILAT WITH PELVIS 2-3 VIEWS LEFT Result Date: 07/15/2023 CLINICAL DATA:  Elective surgery. EXAM: DG HIP (WITH OR WITHOUT PELVIS) 2-3V LEFT COMPARISON:  Preoperative imaging. FINDINGS: Five fluoroscopic spot views of the left hip submitted from the operating room. Femoral intramedullary nail with trans trochanteric and distal locking screw fixation traverse proximal femur fracture. Fluoroscopy time 1 minutes 42 seconds. Dose 14.4 mGy. IMPRESSION: Intraoperative fluoroscopy during proximal femur fracture fixation. Electronically Signed   By: Narda Rutherford M.D.   On: 07/15/2023 14:50   DG C-Arm 1-60 Min-No Report Result Date: 07/15/2023 Fluoroscopy was utilized by the requesting physician.  No radiographic interpretation.   DG Femur Portable Min 2 Views Left Result Date: 07/14/2023 CLINICAL DATA:  Patient with known left intertrochanteric femoral fracture. Full length femur images requested by ordering provider. EXAM: LEFT FEMUR PORTABLE 2 VIEWS COMPARISON:  Left hip radiographs dated 07/14/2023 5:20 p.m. FINDINGS: A  displaced and comminuted intertrochanteric fracture of the left proximal femur is again noted. No additional fracture identified. IMPRESSION: Redemonstrated displaced and comminuted intertrochanteric fracture of the left proximal femur. No additional fracture identified. Electronically Signed   By: Hart Robinsons M.D.   On: 07/14/2023 21:09   DG Chest 1 View Result Date: 07/14/2023 CLINICAL DATA:  Fall, initial encounter, left hip fracture. EXAM: CHEST  1 VIEW COMPARISON:  None Available. FINDINGS: The cardiomediastinal contours are normal. Aortic atherosclerosis. Pulmonary vasculature is normal. No consolidation, pleural effusion, or pneumothorax. No acute osseous abnormalities are seen. IMPRESSION: No active disease. Electronically Signed   By: Narda Rutherford M.D.   On: 07/14/2023 18:53   DG Hip Unilat W or Wo Pelvis 2-3 Views Left Result Date: 07/14/2023 CLINICAL DATA:  Status post fall with pain, shortening and rotation. EXAM: DG HIP (WITH OR WITHOUT PELVIS) 2-3V LEFT COMPARISON:  None Available. FINDINGS: Displaced and comminuted intertrochanteric left proximal femur fracture. Apex lateral angulation with mild proximal migration of the femoral shaft. The femoral head is seated, no hip dislocation. 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  Soft tissue edema is seen lateral to the fracture site. IMPRESSION: Displaced and comminuted intertrochanteric left proximal femur fracture. Electronically Signed   By: Narda Rutherford M.D.   On: 07/14/2023 18:53   CT Head Wo Contrast Result Date: 07/14/2023 CLINICAL DATA:  Fall with trauma to the head and neck EXAM: CT HEAD WITHOUT CONTRAST CT CERVICAL SPINE WITHOUT CONTRAST TECHNIQUE: Multidetector CT imaging of the head and cervical spine was performed following the standard protocol without intravenous contrast. Multiplanar CT image reconstructions of the cervical spine were also generated. RADIATION DOSE REDUCTION: This exam was performed according  to the departmental dose-optimization program which includes automated exposure control, adjustment of the mA and/or kV according to patient size and/or use of iterative reconstruction technique. COMPARISON:  None Available. FINDINGS: CT HEAD FINDINGS Brain: Age related volume loss without subjective lobar predominance. Chronic small-vessel ischemic changes of the cerebral hemispheric white matter without evidence of acute supra tentorial stroke. Benign calcification within the right pons which could be an insignificant idiopathic calcification or could relate to a small cavernoma. Neither should be of clinical relevance. No evidence of acute hemorrhage, mass, hydrocephalus or extra-axial collection. Vascular: There is atherosclerotic calcification of the major vessels at the base of the brain. Skull: Negative Sinuses/Orbits: Clear/normal Other: None CT CERVICAL SPINE FINDINGS Alignment: Mild scoliosis. Skull base and vertebrae: No regional fracture or focal destructive lesion. Minimal depression of the superior endplates of T2 and T3, favored to be old. Soft tissues and spinal canal: No traumatic soft tissue finding. No visible mass or adenopathy. Disc levels: Chronic degenerative spondylosis from C3-4 through C6-7. Mild osteophytic encroachment upon the canal or foramina. No severe or advanced disease. Upper chest: Scarring at the lung apices. Other: None IMPRESSION: HEAD CT: No acute or traumatic finding. Age related volume loss and chronic small-vessel ischemic changes of the white matter. CERVICAL SPINE CT: No acute or traumatic finding. Chronic degenerative spondylosis from C3-4 through C6-7. Minimal depression of the superior endplates of T2 and T3, favored to be old. Electronically Signed   By: Paulina Fusi M.D.   On: 07/14/2023 17:34   CT Cervical Spine Wo Contrast Result Date: 07/14/2023 CLINICAL DATA:  Fall with trauma to the head and neck EXAM: CT HEAD WITHOUT CONTRAST CT CERVICAL SPINE WITHOUT  CONTRAST TECHNIQUE: Multidetector CT imaging of the head and cervical spine was performed following the standard protocol without intravenous contrast. Multiplanar CT image reconstructions of the cervical spine were also generated. RADIATION DOSE REDUCTION: This exam was performed according to the departmental dose-optimization program which includes automated exposure control, adjustment of the mA and/or kV according to patient size and/or use of iterative reconstruction technique. COMPARISON:  None Available. FINDINGS: CT HEAD FINDINGS Brain: Age related volume loss without subjective lobar predominance. Chronic small-vessel ischemic changes of the cerebral hemispheric white matter without evidence of acute supra tentorial stroke. Benign calcification within the right pons which could be an insignificant idiopathic calcification or could relate to a small cavernoma. Neither should be of clinical relevance. No evidence of acute hemorrhage, mass, hydrocephalus or extra-axial collection. Vascular: There is atherosclerotic calcification of the major vessels at the base of the brain. Skull: Negative Sinuses/Orbits: Clear/normal Other: None CT CERVICAL SPINE FINDINGS Alignment: Mild scoliosis. Skull base and vertebrae: No regional fracture or focal destructive lesion. Minimal depression of the superior endplates of T2 and T3, favored to be old. Soft tissues and spinal canal: No traumatic soft tissue finding. No visible mass or adenopathy. Disc levels: Chronic degenerative spondylosis  from C3-4 through C6-7. Mild osteophytic encroachment upon the canal or foramina. No severe or advanced disease. Upper chest: Scarring at the lung apices. Other: None IMPRESSION: HEAD CT: No acute or traumatic finding. Age related volume loss and chronic small-vessel ischemic changes of the white matter. CERVICAL SPINE CT: No acute or traumatic finding. Chronic degenerative spondylosis from C3-4 through C6-7. Minimal depression of the  superior endplates of T2 and T3, favored to be old. Electronically Signed   By: Paulina Fusi M.D.   On: 07/14/2023 17:34      Subjective: Seen and examined on day of discharge.  Stable appropriate for dispo to SNF  Discharge Exam: Vitals:   07/17/23 1559 07/17/23 2037  BP: (!) 112/53 (!) 141/76  Pulse: 78 86  Resp: 17 14  Temp: 98.1 F (36.7 C) 97.8 F (36.6 C)  SpO2: (!) 89% 92%   Vitals:   07/17/23 1300 07/17/23 1559 07/17/23 1559 07/17/23 2037  BP: (!) 118/55 (!) 112/53 (!) 112/53 (!) 141/76  Pulse: 77 78 78 86  Resp: 16 17 17 14   Temp: 98.9 F (37.2 C) 98.1 F (36.7 C) 98.1 F (36.7 C) 97.8 F (36.6 C)  TempSrc: Oral     SpO2: 97% 92% (!) 89% 92%  Weight:      Height:        General: Pt is alert, awake, not in acute distress Cardiovascular: RRR, S1/S2 +, no rubs, no gallops Respiratory: CTA bilaterally, no wheezing, no rhonchi Abdominal: Soft, NT, ND, bowel sounds + Extremities: no edema, no cyanosis    The results of significant diagnostics from this hospitalization (including imaging, microbiology, ancillary and laboratory) are listed below for reference.     Microbiology: Recent Results (from the past 240 hours)  Surgical PCR screen     Status: None   Collection Time: 07/15/23  6:40 AM   Specimen: Nasal Mucosa; Nasal Swab  Result Value Ref Range Status   MRSA, PCR NEGATIVE NEGATIVE Final   Staphylococcus aureus NEGATIVE NEGATIVE Final    Comment: (NOTE) The Xpert SA Assay (FDA approved for NASAL specimens in patients 25 years of age and older), is one component of a comprehensive surveillance program. It is not intended to diagnose infection nor to guide or monitor treatment. Performed at [facility], [address] [place], [place] [address]      Labs: BNP (last 3 results) No results for input(s): "BNP" in the last 8760 hours. Basic Metabolic Panel: Recent Labs  Lab 07/14/23 1655 07/15/23 0757 07/16/23 0529 07/17/23 0553  07/18/23 0425  NA 127* 129* 130* 131* 132*  K 4.0 3.8 3.9 3.9 3.9  CL 91* 95* 96* 95* 97*  CO2 24 26 28 28 28   GLUCOSE 168* 91 99 113* 103*  BUN 21 12 10 15 21   CREATININE 0.90 0.70 0.78 0.68 0.76  CALCIUM 8.7* 8.4* 8.3* 8.2* 8.2*   Liver Function Tests: Recent Labs  Lab 07/14/23 1655  AST 27  ALT 25  ALKPHOS 61  BILITOT 0.8  PROT 6.3*  ALBUMIN 3.7   No results for input(s): "LIPASE", "AMYLASE" in the last 168 hours. No results for input(s): "AMMONIA" in the last 168 hours. CBC: Recent Labs  Lab 07/14/23 1655 07/15/23 0757 07/16/23 0529 07/17/23 0553 07/18/23 0425  WBC 20.9* 10.5 10.1 10.3 10.0  NEUTROABS 18.6* 8.3* 7.6  --   --   HGB 10.6* 9.8* 8.9* 8.9* 8.3*  HCT 30.7* 28.4* 26.4* 25.3* 24.7*  MCV 84.1 84.8 86.0 84.6  86.1  PLT 337 302 291 288 328   Cardiac Enzymes: No results for input(s): "CKTOTAL", "CKMB", "CKMBINDEX", "TROPONINI" in the last 168 hours. BNP: Invalid input(s): "POCBNP" CBG: No results for input(s): "GLUCAP" in the last 168 hours. D-Dimer No results for input(s): "DDIMER" in the last 72 hours. Hgb A1c No results for input(s): "HGBA1C" in the last 72 hours. Lipid Profile No results for input(s): "CHOL", "HDL", "LDLCALC", "TRIG", "CHOLHDL", "LDLDIRECT" in the last 72 hours. Thyroid function studies No results for input(s): "TSH", "T4TOTAL", "T3FREE", "THYROIDAB" in the last 72 hours.  Invalid input(s): "FREET3" Anemia work up Recent Labs    07/16/23 0529  VITAMINB12 1,176*  FOLATE 30.0  FERRITIN 27  TIBC 311  IRON 31   Urinalysis No results found for: "COLORURINE", "APPEARANCEUR", "LABSPEC", "PHURINE", "GLUCOSEU", "HGBUR", "BILIRUBINUR", "KETONESUR", "PROTEINUR", "UROBILINOGEN", "NITRITE", "LEUKOCYTESUR" Sepsis Labs Recent Labs  Lab 07/15/23 0757 07/16/23 0529 07/17/23 0553 07/18/23 0425  WBC 10.5 10.1 10.3 10.0   Microbiology Recent Results (from the past 240 hours)  Surgical PCR screen     Status: None   Collection  Time: 07/15/23  6:40 AM   Specimen: Nasal Mucosa; Nasal Swab  Result Value Ref Range Status   MRSA, PCR NEGATIVE NEGATIVE Final   Staphylococcus aureus NEGATIVE NEGATIVE Final    Comment: (NOTE) The Xpert SA Assay (FDA approved for NASAL specimens in patients 86 years of age and older), is one component of a comprehensive surveillance program. It is not intended to diagnose infection nor to guide or monitor treatment. Performed at [facility], [address] [place], [place] [address]      Time coordinating discharge: Over 30 minutes  SIGNED:   Tresa Moore, MD  [facility] 07/18/2023, 8:40 AM Pager   If 7PM-7AM, please contact night-coverage

## 2023-07-18 NOTE — TOC Transition Note (Addendum)
 Transition of Care [facility]) - Discharge Note   Patient Details  Name: Barbara Galvan MRN: [id number] Date of Birth: [birth year]  Transition of Care [facility]) CM/SW Contact:  Bing Quarry, RN Phone Number: 07/18/2023, 9:23 AM   Clinical Narrative: 07/18/23: Patient is to be discharged and transferred via [facility] to [facility], Room 111 per [facility] Loleta Rose. Report to be called to [phone] byt Unit RN/LPN. EMS forms printed to unit printer at [facility]. Brother was notified on Friday, 07/17/23. DC Summary inboxed to facility via Care Coordination/EPIC destination hub. Will call for [facility] when patient is readied for discharge/transport.   950 am [facility] called for transport. First on list when a convalescent transport truck is available per dispatch. Notified Unit RN of this.  Gabriel Cirri MSN RN CM  RN Case Manager [facility]  Transitions of Care Direct Dial: [phone] (Weekends Only) [facility] Main Office Phone: [phone] [facility] Fax: [phone] [email or web address]       Final next level of care: Skilled Nursing Facility Barriers to Discharge: Barriers Resolved, No Barriers Identified   Patient Goals and CMS Choice   CMS Medicare.gov Compare Post Acute Care list provided to:: Patient Choice offered to / list presented to : Patient      Discharge Placement              Patient chooses bed at: [facility] Patient to be transferred to facility by: [facility] Name of family member notified: Smith,chuck (Brother)  [phone] (Mobile) on 07/17/23 of pending transfer to [facility] on 07/18/23. Patient and family notified of of transfer: 07/18/23 (By provider and CM,  and was discussed Friday 07/17/23 that facility would accept patient today, 07/18/23.)  Discharge Plan and Services Additional resources added to the After Visit Summary for       Post Acute Care Choice:  (STR)          DME Arranged: N/A DME Agency: NA       HH Arranged: NA HH Agency: NA        Social Drivers of Health  (SDOH) Interventions SDOH Screenings   Food Insecurity: No Food Insecurity (07/14/2023)  Housing: Low Risk  (07/14/2023)  Transportation Needs: No Transportation Needs (07/15/2023)  Utilities: Not At Risk (07/14/2023)  Financial Resource Strain: Low Risk  (06/30/2023)   Received from [facility]  Social Connections: Moderately Integrated (07/14/2023)  Tobacco Use: Medium Risk (07/15/2023)     Readmission Risk Interventions     No data to display

## 2023-07-18 NOTE — Plan of Care (Signed)
  Problem: Education: Goal: Knowledge of General Education information will improve Description: Including pain rating scale, medication(s)/side effects and non-pharmacologic comfort measures Outcome: Adequate for Discharge   Problem: Health Behavior/Discharge Planning: Goal: Ability to manage health-related needs will improve Outcome: Adequate for Discharge   Problem: Clinical Measurements: Goal: Ability to maintain clinical measurements within normal limits will improve Outcome: Adequate for Discharge Goal: Will remain free from infection Outcome: Adequate for Discharge Goal: Diagnostic test results will improve Outcome: Adequate for Discharge Goal: Respiratory complications will improve Outcome: Adequate for Discharge Goal: Cardiovascular complication will be avoided Outcome: Adequate for Discharge   Problem: Activity: Goal: Risk for activity intolerance will decrease Outcome: Adequate for Discharge   Problem: Nutrition: Goal: Adequate nutrition will be maintained Outcome: Adequate for Discharge   Problem: Coping: Goal: Level of anxiety will decrease Outcome: Adequate for Discharge   Problem: Elimination: Goal: Will not experience complications related to bowel motility Outcome: Adequate for Discharge Goal: Will not experience complications related to urinary retention Outcome: Adequate for Discharge   Problem: Pain Managment: Goal: General experience of comfort will improve and/or be controlled Outcome: Adequate for Discharge   Problem: Safety: Goal: Ability to remain free from injury will improve Outcome: Adequate for Discharge   Problem: Skin Integrity: Goal: Risk for impaired skin integrity will decrease Outcome: Adequate for Discharge   Problem: Education: Goal: Verbalization of understanding the information provided (i.e., activity precautions, restrictions, etc) will improve Outcome: Adequate for Discharge Goal: Individualized Educational  Video(s) Outcome: Adequate for Discharge   Problem: Activity: Goal: Ability to ambulate and perform ADLs will improve Outcome: Adequate for Discharge   Problem: Clinical Measurements: Goal: Postoperative complications will be avoided or minimized Outcome: Adequate for Discharge   Problem: Self-Concept: Goal: Ability to maintain and perform role responsibilities to the fullest extent possible will improve Outcome: Adequate for Discharge   Problem: Pain Management: Goal: Pain level will decrease Outcome: Adequate for Discharge

## 2023-07-20 ENCOUNTER — Non-Acute Institutional Stay (SKILLED_NURSING_FACILITY): Payer: Self-pay | Admitting: Adult Health

## 2023-07-20 ENCOUNTER — Encounter: Payer: Self-pay | Admitting: Adult Health

## 2023-07-20 DIAGNOSIS — D508 Other iron deficiency anemias: Secondary | ICD-10-CM | POA: Diagnosis not present

## 2023-07-20 DIAGNOSIS — I10 Essential (primary) hypertension: Secondary | ICD-10-CM | POA: Diagnosis not present

## 2023-07-20 DIAGNOSIS — S72142S Displaced intertrochanteric fracture of left femur, sequela: Secondary | ICD-10-CM

## 2023-07-20 DIAGNOSIS — E871 Hypo-osmolality and hyponatremia: Secondary | ICD-10-CM | POA: Diagnosis not present

## 2023-07-20 NOTE — Progress Notes (Unsigned)
 Location:  Other ([facility] Room Number: 111 A Place of Service:  SNF ([phone]) Provider:  Kenard Gower, DNP, FNP-BC  Patient Care Team: Jerl Mina, MD as PCP - General (Family Medicine)  Extended Emergency Contact Information Primary Emergency Contact: Smith,chuck Mobile Phone: [phone] Relation: Brother Secondary Emergency Contact: smith,beverly Mobile Phone: [phone] Relation: Sister  Code Status:   Full Code  Goals of care: Advanced Directive information    07/15/2023   12:06 PM  Advanced Directives  Does Patient Have a Medical Advance Directive? Yes  Type of Advance Directive Living will  Does patient want to make changes to medical advance directive? No - Patient declined     Chief Complaint  Patient presents with   Acute Visit    Follow up hospitalization 2/18 to 07/18/23    HPI:  Barbara Galvan is a 89 y.o. female seen today for medical management of chronic diseases.  ***   Past Medical History:  Diagnosis Date   Arthritis    Elevated lipids    Encounter for diagnostic colonoscopy due to change in bowel habits    Hypertension    Scoliosis    Past Surgical History:  Procedure Laterality Date   CATARACT EXTRACTION     COLONOSCOPY WITH PROPOFOL N/A 11/23/2017   Procedure: COLONOSCOPY WITH PROPOFOL;  Surgeon: Scot Jun, MD;  Location: [facility] ENDOSCOPY;  Service: Endoscopy;  Laterality: N/A;   INTRAMEDULLARY (IM) NAIL INTERTROCHANTERIC Left 07/15/2023   Procedure: INTRAMEDULLARY (IM) NAIL INTERTROCHANTERIC;  Surgeon: Reinaldo Berber, MD;  Location: [facility] ORS;  Service: Orthopedics;  Laterality: Left;   TONSILLECTOMY      No Known Allergies  Outpatient Encounter Medications as of 07/20/2023  Medication Sig   acetaminophen (TYLENOL) 325 MG tablet Take 2 tablets (650 mg total) by mouth every 6 (six) hours as needed for mild pain (pain score 1-3), fever or headache.   alendronate (FOSAMAX) 70 MG tablet Take 70 mg by  mouth once a week. Take with a full glass of water on an empty stomach.   amLODipine (NORVASC) 10 MG tablet Take 10 mg by mouth daily.   aspirin EC 81 MG tablet Take 81 mg by mouth daily.   BIOGAIA PROBIOTIC (BIOGAIA PROBIOTIC) LIQD Take by mouth daily at 8 pm.   calcium carbonate (TUMS - DOSED IN MG ELEMENTAL CALCIUM) 500 MG chewable tablet Chew 1 tablet by mouth daily.   cholecalciferol (VITAMIN D3) 25 MCG (1000 UNIT) tablet Take 1,000 Units by mouth daily.   citalopram (CELEXA) 20 MG tablet Take 20 mg by mouth daily.   dicyclomine (BENTYL) 20 MG tablet Take 20 mg by mouth every 6 (six) hours.   enoxaparin (LOVENOX) 40 MG/0.4ML injection Inject 0.4 mLs (40 mg total) into the skin daily for 14 days.   ferrous sulfate 325 (65 FE) MG tablet Take 1 tablet (325 mg total) by mouth daily with breakfast.   lisinopril (PRINIVIL,ZESTRIL) 10 MG tablet Take 10 mg by mouth daily.   methocarbamol (ROBAXIN) 500 MG tablet Take 250-500 mg by mouth 2 (two) times daily as needed.   multivitamin-lutein (OCUVITE-LUTEIN) CAPS capsule Take 2 capsules by mouth daily.   Omega-3 Fatty Acids (FISH OIL CONCENTRATE PO) Take by mouth.   oxyCODONE (OXY IR/ROXICODONE) 5 MG immediate release tablet Take 0.5-1 tablets (2.5-5 mg total) by mouth every 6 (six) hours as needed for moderate pain (pain score 4-6) or severe pain (pain score 7-10).   vitamin E (VITAMIN E) 1000 UNIT capsule Take 1,000  Units by mouth daily.   No facility-administered encounter medications on file as of 07/20/2023.    Review of Systems  Constitutional:  Negative for appetite change, chills, fatigue and fever.  HENT:  Negative for congestion, hearing loss, rhinorrhea and sore throat.   Eyes: Negative.   Respiratory:  Negative for cough, shortness of breath and wheezing.   Cardiovascular:  Negative for chest pain, palpitations and leg swelling.  Gastrointestinal:  Negative for abdominal pain, constipation, diarrhea, nausea and vomiting.   Genitourinary:  Negative for dysuria.  Musculoskeletal:  Negative for arthralgias, back pain and myalgias.  Skin:  Negative for color change, rash and wound.  Neurological:  Negative for dizziness, weakness and headaches.  Psychiatric/Behavioral:  Negative for behavioral problems. The patient is not nervous/anxious.    ***     There is no immunization history on file for this patient. Pertinent  Health Maintenance Due  Topic Date Due   DEXA SCAN  Never done   INFLUENZA VACCINE  12/25/2022       No data to display           Vitals:   07/20/23 1725  BP: 124/64  Pulse: 84  Resp: 18  Temp: 97.9 F (36.6 C)  SpO2: 93%  Weight: 95 lb (43.1 kg)  Height: 4\' 11"  (1.499 m)   Body mass index is 19.19 kg/m.  Physical Exam Constitutional:      Appearance: Normal appearance.  HENT:     Head: Normocephalic and atraumatic.     Nose: Nose normal.     Mouth/Throat:     Mouth: Mucous membranes are moist.  Eyes:     Conjunctiva/sclera: Conjunctivae normal.  Cardiovascular:     Rate and Rhythm: Normal rate and regular rhythm.  Pulmonary:     Effort: Pulmonary effort is normal.     Breath sounds: Normal breath sounds.  Abdominal:     General: Bowel sounds are normal.     Palpations: Abdomen is soft.  Musculoskeletal:        General: Normal range of motion.     Cervical back: Normal range of motion.  Skin:    General: Skin is warm and dry.     Comments: 2 surgical wound on left thigh with honey comb dressing, dry, no erythema Has bruise on forehead and right eye  Neurological:     General: No focal deficit present.     Mental Status: She is alert and oriented to person, place, and time.  Psychiatric:        Mood and Affect: Mood normal.        Behavior: Behavior normal.        Thought Content: Thought content normal.        Judgment: Judgment normal.        Labs reviewed: Recent Labs    07/16/23 0529 07/17/23 0553 07/18/23 0425  NA 130* 131* 132*  K 3.9  3.9 3.9  CL 96* 95* 97*  CO2 28 28 28   GLUCOSE 99 113* 103*  BUN 10 15 21   CREATININE 0.78 0.68 0.76  CALCIUM 8.3* 8.2* 8.2*   Recent Labs    07/14/23 1655  AST 27  ALT 25  ALKPHOS 61  BILITOT 0.8  PROT 6.3*  ALBUMIN 3.7   Recent Labs    07/14/23 1655 07/15/23 0757 07/16/23 0529 07/17/23 0553 07/18/23 0425  WBC 20.9* 10.5 10.1 10.3 10.0  NEUTROABS 18.6* 8.3* 7.6  --   --   HGB 10.6* 9.8*  8.9* 8.9* 8.3*  HCT 30.7* 28.4* 26.4* 25.3* 24.7*  MCV 84.1 84.8 86.0 84.6 86.1  PLT 337 302 291 288 328   No results found for: "TSH" No results found for: "HGBA1C" No results found for: "CHOL", "HDL", "LDLCALC", "LDLDIRECT", "TRIG", "CHOLHDL"  Significant Diagnostic Results in last 30 days:  DG HIP UNILAT WITH PELVIS 2-3 VIEWS LEFT Result Date: 07/15/2023 CLINICAL DATA:  Elective surgery. EXAM: DG HIP (WITH OR WITHOUT PELVIS) 2-3V LEFT COMPARISON:  Preoperative imaging. FINDINGS: Five fluoroscopic spot views of the left hip submitted from the operating room. Femoral intramedullary nail with trans trochanteric and distal locking screw fixation traverse proximal femur fracture. Fluoroscopy time 1 minutes 42 seconds. Dose 14.4 mGy. IMPRESSION: Intraoperative fluoroscopy during proximal femur fracture fixation. Electronically Signed   By: Narda Rutherford M.D.   On: 07/15/2023 14:50   DG C-Arm 1-60 Min-No Report Result Date: 07/15/2023 Fluoroscopy was utilized by the requesting physician.  No radiographic interpretation.   DG Femur Portable Min 2 Views Left Result Date: 07/14/2023 CLINICAL DATA:  Patient with known left intertrochanteric femoral fracture. Full length femur images requested by ordering provider. EXAM: LEFT FEMUR PORTABLE 2 VIEWS COMPARISON:  Left hip radiographs dated 07/14/2023 5:20 p.m. FINDINGS: A displaced and comminuted intertrochanteric fracture of the left proximal femur is again noted. No additional fracture identified. IMPRESSION: Redemonstrated displaced and  comminuted intertrochanteric fracture of the left proximal femur. No additional fracture identified. Electronically Signed   By: Hart Robinsons M.D.   On: 07/14/2023 21:09   DG Chest 1 View Result Date: 07/14/2023 CLINICAL DATA:  Fall, initial encounter, left hip fracture. EXAM: CHEST  1 VIEW COMPARISON:  None Available. FINDINGS: The cardiomediastinal contours are normal. Aortic atherosclerosis. Pulmonary vasculature is normal. No consolidation, pleural effusion, or pneumothorax. No acute osseous abnormalities are seen. IMPRESSION: No active disease. Electronically Signed   By: Narda Rutherford M.D.   On: 07/14/2023 18:53   DG Hip Unilat W or Wo Pelvis 2-3 Views Left Result Date: 07/14/2023 CLINICAL DATA:  Status post fall with pain, shortening and rotation. EXAM: DG HIP (WITH OR WITHOUT PELVIS) 2-3V LEFT COMPARISON:  None Available. FINDINGS: Displaced and comminuted intertrochanteric left proximal femur fracture. Apex lateral angulation with mild proximal migration of the femoral shaft. The femoral head is seated, no hip dislocation. 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 Soft tissue edema is seen lateral to the fracture site. IMPRESSION: Displaced and comminuted intertrochanteric left proximal femur fracture. Electronically Signed   By: Narda Rutherford M.D.   On: 07/14/2023 18:53   CT Head Wo Contrast Result Date: 07/14/2023 CLINICAL DATA:  Fall with trauma to the head and neck EXAM: CT HEAD WITHOUT CONTRAST CT CERVICAL SPINE WITHOUT CONTRAST TECHNIQUE: Multidetector CT imaging of the head and cervical spine was performed following the standard protocol without intravenous contrast. Multiplanar CT image reconstructions of the cervical spine were also generated. RADIATION DOSE REDUCTION: This exam was performed according to the departmental dose-optimization program which includes automated exposure control, adjustment of the mA and/or kV according to patient size and/or use of iterative  reconstruction technique. COMPARISON:  None Available. FINDINGS: CT HEAD FINDINGS Brain: Age related volume loss without subjective lobar predominance. Chronic small-vessel ischemic changes of the cerebral hemispheric white matter without evidence of acute supra tentorial stroke. Benign calcification within the right pons which could be an insignificant idiopathic calcification or could relate to a small cavernoma. Neither should be of clinical relevance. No evidence of acute hemorrhage, mass, hydrocephalus or extra-axial collection.  Vascular: There is atherosclerotic calcification of the major vessels at the base of the brain. Skull: Negative Sinuses/Orbits: Clear/normal Other: None CT CERVICAL SPINE FINDINGS Alignment: Mild scoliosis. Skull base and vertebrae: No regional fracture or focal destructive lesion. Minimal depression of the superior endplates of T2 and T3, favored to be old. Soft tissues and spinal canal: No traumatic soft tissue finding. No visible mass or adenopathy. Disc levels: Chronic degenerative spondylosis from C3-4 through C6-7. Mild osteophytic encroachment upon the canal or foramina. No severe or advanced disease. Upper chest: Scarring at the lung apices. Other: None IMPRESSION: HEAD CT: No acute or traumatic finding. Age related volume loss and chronic small-vessel ischemic changes of the white matter. CERVICAL SPINE CT: No acute or traumatic finding. Chronic degenerative spondylosis from C3-4 through C6-7. Minimal depression of the superior endplates of T2 and T3, favored to be old. Electronically Signed   By: Paulina Fusi M.D.   On: 07/14/2023 17:34   CT Cervical Spine Wo Contrast Result Date: 07/14/2023 CLINICAL DATA:  Fall with trauma to the head and neck EXAM: CT HEAD WITHOUT CONTRAST CT CERVICAL SPINE WITHOUT CONTRAST TECHNIQUE: Multidetector CT imaging of the head and cervical spine was performed following the standard protocol without intravenous contrast. Multiplanar CT image  reconstructions of the cervical spine were also generated. RADIATION DOSE REDUCTION: This exam was performed according to the departmental dose-optimization program which includes automated exposure control, adjustment of the mA and/or kV according to patient size and/or use of iterative reconstruction technique. COMPARISON:  None Available. FINDINGS: CT HEAD FINDINGS Brain: Age related volume loss without subjective lobar predominance. Chronic small-vessel ischemic changes of the cerebral hemispheric white matter without evidence of acute supra tentorial stroke. Benign calcification within the right pons which could be an insignificant idiopathic calcification or could relate to a small cavernoma. Neither should be of clinical relevance. No evidence of acute hemorrhage, mass, hydrocephalus or extra-axial collection. Vascular: There is atherosclerotic calcification of the major vessels at the base of the brain. Skull: Negative Sinuses/Orbits: Clear/normal Other: None CT CERVICAL SPINE FINDINGS Alignment: Mild scoliosis. Skull base and vertebrae: No regional fracture or focal destructive lesion. Minimal depression of the superior endplates of T2 and T3, favored to be old. Soft tissues and spinal canal: No traumatic soft tissue finding. No visible mass or adenopathy. Disc levels: Chronic degenerative spondylosis from C3-4 through C6-7. Mild osteophytic encroachment upon the canal or foramina. No severe or advanced disease. Upper chest: Scarring at the lung apices. Other: None IMPRESSION: HEAD CT: No acute or traumatic finding. Age related volume loss and chronic small-vessel ischemic changes of the white matter. CERVICAL SPINE CT: No acute or traumatic finding. Chronic degenerative spondylosis from C3-4 through C6-7. Minimal depression of the superior endplates of T2 and T3, favored to be old. Electronically Signed   By: Paulina Fusi M.D.   On: 07/14/2023 17:34    Assessment/Plan ***   Family/ staff  Communication: Discussed plan of care with resident and charge nurse  Labs/tests ordered:     Kenard Gower, DNP, MSN, FNP-BC [facility] [phone] (Monday-Friday 8:00 a.m. - 5:00 p.m.) [phone] (after hours)

## 2023-07-23 ENCOUNTER — Ambulatory Visit: Payer: Medicare Other | Admitting: Cardiovascular Disease

## 2023-07-23 LAB — BASIC METABOLIC PANEL
BUN: 20 (ref 4–21)
CO2: 26 — AB (ref 13–22)
Chloride: 94 — AB (ref 99–108)
Creatinine: 0.8 (ref 0.5–1.1)
Glucose: 98
Potassium: 3.6 meq/L (ref 3.5–5.1)
Sodium: 130 — AB (ref 137–147)

## 2023-07-23 LAB — CBC: RBC: 3.38 — AB (ref 3.87–5.11)

## 2023-07-23 LAB — CBC AND DIFFERENTIAL
HCT: 30 — AB (ref 36–46)
Hemoglobin: 9.8 — AB (ref 12.0–16.0)
Platelets: 690 10*3/uL — AB (ref 150–400)
WBC: 18.4

## 2023-07-23 LAB — COMPREHENSIVE METABOLIC PANEL
Calcium: 8 — AB (ref 8.7–10.7)
eGFR: 73

## 2023-07-23 NOTE — Anesthesia Postprocedure Evaluation (Signed)
 Anesthesia Post Note  Patient: Barbara Galvan  Procedure(s) Performed: INTRAMEDULLARY (IM) NAIL INTERTROCHANTERIC (Left: Hip)  Patient location during evaluation: PACU Anesthesia Type: Spinal Level of consciousness: awake and awake and alert Pain management: satisfactory to patient Vital Signs Assessment: post-procedure vital signs reviewed and stable Respiratory status: spontaneous breathing Cardiovascular status: blood pressure returned to baseline Anesthetic complications: no   There were no known notable events for this encounter.   Last Vitals:  Vitals:   07/17/23 2037 07/18/23 0923  BP: (!) 141/76 135/77  Pulse: 86 84  Resp: 14 16  Temp: 36.6 C 36.7 C  SpO2: 92% 94%    Last Pain:  Vitals:   07/18/23 1108  TempSrc:   PainSc: 8                  VAN STAVEREN,Alandra Sando

## 2023-07-31 ENCOUNTER — Non-Acute Institutional Stay (SKILLED_NURSING_FACILITY): Payer: Self-pay | Admitting: Student

## 2023-07-31 ENCOUNTER — Encounter: Payer: Self-pay | Admitting: Student

## 2023-07-31 DIAGNOSIS — S72142A Displaced intertrochanteric fracture of left femur, initial encounter for closed fracture: Secondary | ICD-10-CM

## 2023-07-31 DIAGNOSIS — I10 Essential (primary) hypertension: Secondary | ICD-10-CM

## 2023-07-31 DIAGNOSIS — E440 Moderate protein-calorie malnutrition: Secondary | ICD-10-CM | POA: Diagnosis not present

## 2023-07-31 DIAGNOSIS — E871 Hypo-osmolality and hyponatremia: Secondary | ICD-10-CM

## 2023-07-31 NOTE — Progress Notes (Signed)
 Provider:  Dr. Earnestine Mealing Location:  Other [place].  Nursing Home Room Number: [facility] 111A Place of Service:  SNF (31)  PCP: Jerl Mina, MD Patient Care Team: Jerl Mina, MD as PCP - General (Family Medicine)  Extended Emergency Contact Information Primary Emergency Contact: Smith,chuck Mobile Phone: [phone] Relation: Brother Secondary Emergency Contact: smith,beverly Mobile Phone: [phone] Relation: Sister  Code Status: Full Code Goals of Care: Advanced Directive information    07/15/2023   12:06 PM  Advanced Directives  Does Patient Have a Medical Advance Directive? Yes  Type of Advance Directive Living will  Does patient want to make changes to medical advance directive? No - Patient declined      Chief Complaint  Patient presents with   New Admit To SNF    Admission.     HPI: Patient is a 89 y.o. female seen today for admission to [facility].  History of Present Illness The patient is an 89 year old female who presents for admission after hospitalization due to a fall and femur fracture.  She was hospitalized from July 14, 2023, to July 18, 2023, after falling in her yard and hitting her head, without loss of consciousness. During the fall, she sustained a fracture to her left femur, which required surgical intervention to insert a steel rod. She has a history of iron deficiency anemia, hyponatremia, and hypertension.  She reports significant swelling in her right hand, which she attributes to a possible insect bite. There is no history of allergies, and she denies any recent exposure to potential allergens such as cats or rose bushes. Her sister-in-law, a Engineer, civil (consulting), suggested it might be a bug bite. She has taken Benadryl in the past without issues.  She has a history of hypertension and is currently on two antihypertensive medications. Blood pressure management is crucial, especially post-surgery.  She has a history of irritable  bowel syndrome, for which she is careful with her diet. No issues with urination and regular bowel movements, although she is cautious due to irritable bowel syndrome.  Socially, she lives alone in [place], but her brother and his wife live across the street and are concerned about her living alone post-hospitalization. She was previously independent, able to drive to church and the grocery store. 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   Past Medical History:  Diagnosis Date   Arthritis    Elevated lipids    Encounter for diagnostic colonoscopy due to change in bowel habits    Hypertension    Scoliosis    Past Surgical History:  Procedure Laterality Date   CATARACT EXTRACTION     COLONOSCOPY WITH PROPOFOL N/A 11/23/2017   Procedure: COLONOSCOPY WITH PROPOFOL;  Surgeon: Scot Jun, MD;  Location[facility] ENDOSCOPY;  Service: Endoscopy;  Laterality: N/A;   INTRAMEDULLARY (IM) NAIL INTERTROCHANTERIC Left 07/15/2023   Procedure: INTRAMEDULLARY (IM) NAIL INTERTROCHANTERIC;  Surgeon: Reinaldo Berber, MD;  Location: [facility] ORS;  Service: Orthopedics;  Laterality: Left;   TONSILLECTOMY      reports that she quit smoking about 12 years ago. Her smoking use included cigarettes. She has never used smokeless tobacco. She reports that she does not drink alcohol and does not use drugs. Social History   Socioeconomic History   Marital status: Married    Spouse name: Not on file   Number of children: Not on file   Years of education: Not on file   Highest education level:  Not on file  Occupational History   Not on file  Tobacco Use   Smoking status: Former    Current packs/day: 0.00    Types: Cigarettes    Quit date: 07/17/2011    Years since quitting: 12.0   Smokeless tobacco: Never  Vaping Use   Vaping status: Never Used  Substance and Sexual Activity   Alcohol use: Never   Drug use: Never   Sexual activity: Not on file  Other  Topics Concern   Not on file  Social History Narrative   Not on file   Social Drivers of Health   Financial Resource Strain: Low Risk  (06/30/2023)   Received from [facility]   Overall Financial Resource Strain (CARDIA)    Difficulty of Paying Living Expenses: Not hard at all  Food Insecurity: No Food Insecurity (07/14/2023)   Hunger Vital Sign    Worried About Running Out of Food in the Last Year: Never true    Ran Out of Food in the Last Year: Never true  Transportation Needs: No Transportation Needs (07/15/2023)   PRAPARE - Administrator, Civil Service (Medical): No    Lack of Transportation (Non-Medical): No  Physical Activity: Not on file  Stress: Not on file  Social Connections: Moderately Integrated (07/14/2023)   Social Connection and Isolation Panel [NHANES]    Frequency of Communication with Friends and Family: More than three times a week    Frequency of Social Gatherings with Friends and Family: More than three times a week    Attends Religious Services: 1 to 4 times per year    Active Member of [employer] or Organizations: Yes    Attends Banker Meetings: 1 to 4 times per year    Marital Status: Widowed  Intimate Partner Violence: Not At Risk (07/14/2023)   Humiliation, Afraid, Rape, and Kick questionnaire    Fear of Current or Ex-Partner: No    Emotionally Abused: No    Physically Abused: No    Sexually Abused: No    Functional Status Survey:    History reviewed. No pertinent family history.  Health Maintenance  Topic Date Due   DTaP/Tdap/Td (1 - Tdap) Never done   DEXA SCAN  Never done   Pneumonia Vaccine 51+ Years old (1 of 1 - PCV) Never done   Zoster Vaccines- Shingrix (2 of 2) 03/18/2018   COVID-19 Vaccine (2 - Pfizer risk series) 10/09/2020   INFLUENZA VACCINE  12/25/2022   Medicare Annual Wellness (AWV)  06/29/2024   HPV VACCINES  Aged Out    No Known Allergies  Outpatient Encounter Medications as of  07/31/2023  Medication Sig   acetaminophen (TYLENOL) 325 MG tablet Take 2 tablets (650 mg total) by mouth every 6 (six) hours as needed for mild pain (pain score 1-3), fever or headache.   alendronate (FOSAMAX) 70 MG tablet Take 70 mg by mouth once a week. Take with a full glass of water on an empty stomach.   amLODipine (NORVASC) 10 MG tablet Take 10 mg by mouth daily.   aspirin EC 81 MG tablet Take 81 mg by mouth daily.   BIOGAIA PROBIOTIC (BIOGAIA PROBIOTIC) LIQD Take by mouth daily at 8 pm.   bismuth subsalicylate (PEPTO BISMOL) 262 MG/15ML suspension Take 30 mLs by mouth every 4 (four) hours as needed.   calcium carbonate (TUMS - DOSED IN MG ELEMENTAL CALCIUM) 500 MG chewable tablet Chew 1 tablet by mouth daily.   cholecalciferol (VITAMIN  D3) 25 MCG (1000 UNIT) tablet Take 1,000 Units by mouth daily.   citalopram (CELEXA) 20 MG tablet Take 20 mg by mouth daily.   dicyclomine (BENTYL) 20 MG tablet Take 20 mg by mouth every 6 (six) hours.   enoxaparin (LOVENOX) 40 MG/0.4ML injection Inject 0.4 mLs (40 mg total) into the skin daily for 14 days.   lisinopril (PRINIVIL,ZESTRIL) 10 MG tablet Take 10 mg by mouth daily.   loperamide (IMODIUM) 2 MG capsule Take 2 mg by mouth as needed for diarrhea or loose stools.   methocarbamol (ROBAXIN) 500 MG tablet Take 250-500 mg by mouth 2 (two) times daily as needed.   multivitamin-lutein (OCUVITE-LUTEIN) CAPS capsule Take 2 capsules by mouth daily.   Omega-3 Fatty Acids (FISH OIL CONCENTRATE PO) Take 1 capsule by mouth daily.   oxyCODONE (OXY IR/ROXICODONE) 5 MG immediate release tablet Take 0.5-1 tablets (2.5-5 mg total) by mouth every 6 (six) hours as needed for moderate pain (pain score 4-6) or severe pain (pain score 7-10).   vitamin E (VITAMIN E) 1000 UNIT capsule Take 1,000 Units by mouth daily.   ferrous sulfate 325 (65 FE) MG tablet Take 1 tablet (325 mg total) by mouth daily with breakfast. (Patient not taking: Reported on 07/31/2023)   No  facility-administered encounter medications on file as of 07/31/2023.    Review of Systems  Vitals:   07/31/23 0928  BP: 116/81  Pulse: 76  Resp: 20  Temp: 97.9 F (36.6 C)  SpO2: 93%  Weight: 95 lb 3.2 oz (43.2 kg)  Height: 4\' 11"  (1.499 m)   Body mass index is 19.23 kg/m. Physical Exam Musculoskeletal:     Comments: 2+ pitting edema of LUE, bilateral LE. Skin is flesh-colored with no erythema, induration, tenderness to palpation     Labs reviewed: Basic Metabolic Panel: Recent Labs    07/16/23 0529 07/17/23 0553 07/18/23 0425 07/23/23 0000  NA 130* 131* 132* 130*  K 3.9 3.9 3.9 3.6  CL 96* 95* 97* 94*  CO2 28 28 28  26*  GLUCOSE 99 113* 103*  --   BUN 10 15 21 20   CREATININE 0.78 0.68 0.76 0.8  CALCIUM 8.3* 8.2* 8.2* 8.0*   Liver Function Tests: Recent Labs    07/14/23 1655  AST 27  ALT 25  ALKPHOS 61  BILITOT 0.8  PROT 6.3*  ALBUMIN 3.7   No results for input(s): "LIPASE", "AMYLASE" in the last 8760 hours. No results for input(s): "AMMONIA" in the last 8760 hours. CBC: Recent Labs    07/14/23 1655 07/15/23 0757 07/16/23 0529 07/17/23 0553 07/18/23 0425 07/23/23 0000  WBC 20.9* 10.5 10.1 10.3 10.0 18.4  NEUTROABS 18.6* 8.3* 7.6  --   --   --   HGB 10.6* 9.8* 8.9* 8.9* 8.3* 9.8*  HCT 30.7* 28.4* 26.4* 25.3* 24.7* 30*  MCV 84.1 84.8 86.0 84.6 86.1  --   PLT 337 302 291 288 328 690*   Cardiac Enzymes: No results for input(s): "CKTOTAL", "CKMB", "CKMBINDEX", "TROPONINI" in the last 8760 hours. BNP: Invalid input(s): "POCBNP" No results found for: "HGBA1C" No results found for: "TSH" Lab Results  Component Value Date   VITAMINB12 1,176 (H) 07/16/2023   Lab Results  Component Value Date   FOLATE 30.0 07/16/2023   Lab Results  Component Value Date   IRON 31 07/16/2023   TIBC 311 07/16/2023   FERRITIN 27 07/16/2023    Imaging and Procedures obtained prior to SNF admission: DG HIP UNILAT WITH PELVIS 2-3 VIEWS  LEFT Result Date:  07/15/2023 CLINICAL DATA:  Elective surgery. EXAM: DG HIP (WITH OR WITHOUT PELVIS) 2-3V LEFT COMPARISON:  Preoperative imaging. FINDINGS: Five fluoroscopic spot views of the left hip submitted from the operating room. Femoral intramedullary nail with trans trochanteric and distal locking screw fixation traverse proximal femur fracture. Fluoroscopy time 1 minutes 42 seconds. Dose 14.4 mGy. IMPRESSION: Intraoperative fluoroscopy during proximal femur fracture fixation. Electronically Signed   By: Narda Rutherford M.D.   On: 07/15/2023 14:50   DG C-Arm 1-60 Min-No Report Result Date: 07/15/2023 Fluoroscopy was utilized by the requesting physician.  No radiographic interpretation.   DG Femur Portable Min 2 Views Left Result Date: 07/14/2023 CLINICAL DATA:  Patient with known left intertrochanteric femoral fracture. Full length femur images requested by ordering provider. EXAM: LEFT FEMUR PORTABLE 2 VIEWS COMPARISON:  Left hip radiographs dated 07/14/2023 5:20 p.m. FINDINGS: A displaced and comminuted intertrochanteric fracture of the left proximal femur is again noted. No additional fracture identified. IMPRESSION: Redemonstrated displaced and comminuted intertrochanteric fracture of the left proximal femur. No additional fracture identified. Electronically Signed   By: Hart Robinsons M.D.   On: 07/14/2023 21:09   DG Chest 1 View Result Date: 07/14/2023 CLINICAL DATA:  Fall, initial encounter, left hip fracture. EXAM: CHEST  1 VIEW COMPARISON:  None Available. FINDINGS: The cardiomediastinal contours are normal. Aortic atherosclerosis. Pulmonary vasculature is normal. No consolidation, pleural effusion, or pneumothorax. No acute osseous abnormalities are seen. IMPRESSION: No active disease. Electronically Signed   By: Narda Rutherford M.D.   On: 07/14/2023 18:53   DG Hip Unilat W or Wo Pelvis 2-3 Views Left Result Date: 07/14/2023 CLINICAL DATA:  Status post fall with pain, shortening and rotation. EXAM:  DG HIP (WITH OR WITHOUT PELVIS) 2-3V LEFT COMPARISON:  None Available. FINDINGS: Displaced and comminuted intertrochanteric left proximal femur fracture. Apex lateral angulation with mild proximal migration of the femoral shaft. The femoral head is seated, no hip dislocation. 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 Soft tissue edema is seen lateral to the fracture site. IMPRESSION: Displaced and comminuted intertrochanteric left proximal femur fracture. Electronically Signed   By: Narda Rutherford M.D.   On: 07/14/2023 18:53   CT Head Wo Contrast Result Date: 07/14/2023 CLINICAL DATA:  Fall with trauma to the head and neck EXAM: CT HEAD WITHOUT CONTRAST CT CERVICAL SPINE WITHOUT CONTRAST TECHNIQUE: Multidetector CT imaging of the head and cervical spine was performed following the standard protocol without intravenous contrast. Multiplanar CT image reconstructions of the cervical spine were also generated. RADIATION DOSE REDUCTION: This exam was performed according to the departmental dose-optimization program which includes automated exposure control, adjustment of the mA and/or kV according to patient size and/or use of iterative reconstruction technique. COMPARISON:  None Available. FINDINGS: CT HEAD FINDINGS Brain: Age related volume loss without subjective lobar predominance. Chronic small-vessel ischemic changes of the cerebral hemispheric white matter without evidence of acute supra tentorial stroke. Benign calcification within the right pons which could be an insignificant idiopathic calcification or could relate to a small cavernoma. Neither should be of clinical relevance. No evidence of acute hemorrhage, mass, hydrocephalus or extra-axial collection. Vascular: There is atherosclerotic calcification of the major vessels at the base of the brain. Skull: Negative Sinuses/Orbits: Clear/normal Other: None CT CERVICAL SPINE FINDINGS Alignment: Mild scoliosis. Skull base and vertebrae: No regional  fracture or focal destructive lesion. Minimal depression of the superior endplates of T2 and T3, favored to be old. Soft tissues and spinal canal: No traumatic soft  tissue finding. No visible mass or adenopathy. Disc levels: Chronic degenerative spondylosis from C3-4 through C6-7. Mild osteophytic encroachment upon the canal or foramina. No severe or advanced disease. Upper chest: Scarring at the lung apices. Other: None IMPRESSION: HEAD CT: No acute or traumatic finding. Age related volume loss and chronic small-vessel ischemic changes of the white matter. CERVICAL SPINE CT: No acute or traumatic finding. Chronic degenerative spondylosis from C3-4 through C6-7. Minimal depression of the superior endplates of T2 and T3, favored to be old. Electronically Signed   By: Paulina Fusi M.D.   On: 07/14/2023 17:34   CT Cervical Spine Wo Contrast Result Date: 07/14/2023 CLINICAL DATA:  Fall with trauma to the head and neck EXAM: CT HEAD WITHOUT CONTRAST CT CERVICAL SPINE WITHOUT CONTRAST TECHNIQUE: Multidetector CT imaging of the head and cervical spine was performed following the standard protocol without intravenous contrast. Multiplanar CT image reconstructions of the cervical spine were also generated. RADIATION DOSE REDUCTION: This exam was performed according to the departmental dose-optimization program which includes automated exposure control, adjustment of the mA and/or kV according to patient size and/or use of iterative reconstruction technique. COMPARISON:  None Available. FINDINGS: CT HEAD FINDINGS Brain: Age related volume loss without subjective lobar predominance. Chronic small-vessel ischemic changes of the cerebral hemispheric white matter without evidence of acute supra tentorial stroke. Benign calcification within the right pons which could be an insignificant idiopathic calcification or could relate to a small cavernoma. Neither should be of clinical relevance. No evidence of acute hemorrhage, mass,  hydrocephalus or extra-axial collection. Vascular: There is atherosclerotic calcification of the major vessels at the base of the brain. Skull: Negative Sinuses/Orbits: Clear/normal Other: None CT CERVICAL SPINE FINDINGS Alignment: Mild scoliosis. Skull base and vertebrae: No regional fracture or focal destructive lesion. Minimal depression of the superior endplates of T2 and T3, favored to be old. Soft tissues and spinal canal: No traumatic soft tissue finding. No visible mass or adenopathy. Disc levels: Chronic degenerative spondylosis from C3-4 through C6-7. Mild osteophytic encroachment upon the canal or foramina. No severe or advanced disease. Upper chest: Scarring at the lung apices. Other: None IMPRESSION: HEAD CT: No acute or traumatic finding. Age related volume loss and chronic small-vessel ischemic changes of the white matter. CERVICAL SPINE CT: No acute or traumatic finding. Chronic degenerative spondylosis from C3-4 through C6-7. Minimal depression of the superior endplates of T2 and T3, favored to be old. Electronically Signed   By: Paulina Fusi M.D.   On: 07/14/2023 17:34    Assessment/Plan Left Femur Fracture Sustained a left femur fracture after falling, treated with surgical placement of a steel rod. Healing is progressing, but persistent fluid accumulation in the legs since hospitalization. - Monitor healing of the left femur fracture - Assess for complications related to surgical intervention  Right Hand Swelling Significant swelling of the right hand, likely an allergic reaction, possibly from a bug bite. No infection or drainage. No history of breast cancer or recent arm surgery. Benadryl considered for management, with potential side effects of confusion and urinary retention. - Administer Benadryl for allergic reaction - Elevate right hand above heart to facilitate fluid drainage - Monitor and measure swelling to ensure reduction  Hypertension Hypertension managed with two  antihypertensive medications. - Continue current antihypertensive medications  Irritable Bowel Syndrome (IBS) Irritable bowel syndrome managed with dietary modifications to prevent exacerbations. - Continue dietary management for IBS  Goals of Care Prefers no resuscitation or frequent hospital visits, prioritizes comfort and hospice  care if condition worsens. Brother holds advanced directives. - Document and communicate end-of-life care preferences to healthcare team   Family/ staff Communication: Nursing  Labs/tests ordered:none

## 2023-08-01 ENCOUNTER — Encounter: Payer: Self-pay | Admitting: Student

## 2023-08-13 ENCOUNTER — Encounter: Payer: Self-pay | Admitting: Nurse Practitioner

## 2023-08-13 ENCOUNTER — Non-Acute Institutional Stay (SKILLED_NURSING_FACILITY): Payer: Self-pay | Admitting: Nurse Practitioner

## 2023-08-13 DIAGNOSIS — E871 Hypo-osmolality and hyponatremia: Secondary | ICD-10-CM

## 2023-08-13 DIAGNOSIS — M48061 Spinal stenosis, lumbar region without neurogenic claudication: Secondary | ICD-10-CM

## 2023-08-13 DIAGNOSIS — M810 Age-related osteoporosis without current pathological fracture: Secondary | ICD-10-CM

## 2023-08-13 DIAGNOSIS — K580 Irritable bowel syndrome with diarrhea: Secondary | ICD-10-CM | POA: Diagnosis not present

## 2023-08-13 DIAGNOSIS — M7989 Other specified soft tissue disorders: Secondary | ICD-10-CM

## 2023-08-13 DIAGNOSIS — I10 Essential (primary) hypertension: Secondary | ICD-10-CM

## 2023-08-13 DIAGNOSIS — D508 Other iron deficiency anemias: Secondary | ICD-10-CM | POA: Diagnosis not present

## 2023-08-13 DIAGNOSIS — S72142A Displaced intertrochanteric fracture of left femur, initial encounter for closed fracture: Secondary | ICD-10-CM

## 2023-08-13 DIAGNOSIS — S72142S Displaced intertrochanteric fracture of left femur, sequela: Secondary | ICD-10-CM

## 2023-08-13 NOTE — Progress Notes (Unsigned)
 Location:  Other Nursing Home Room Number: [facility] 111A Place of Service:  SNF ([id number])  Jerl Mina, MD  Patient Care Team: Jerl Mina, MD as PCP - General (Family Medicine)  Extended Emergency Contact Information Primary Emergency Contact: Smith,chuck Mobile Phone: [phone] Relation: Brother Secondary Emergency Contact: smith,beverly Mobile Phone: [phone] Relation: Sister  Goals of care: Advanced Directive information    07/15/2023   12:06 PM  Advanced Directives  Does Patient Have a Medical Advance Directive? Yes  Type of Advance Directive Living will  Does patient want to make changes to medical advance directive? No - Patient declined     Chief Complaint  Patient presents with   Discharge Note    Discharge.     HPI:  Pt is a 89 y.o. female seen today for discharge home.  She patient is in skilled facility with a left hip fracture following a fall on 07/14/2023. She is accompanied by her brother, who lives nearby and assists her. Her brother does not feel like she can go home safely and needs more assistance.   She experienced another fall in facility yesterday evening The fall occurred when she stood up wearing inappropriate socks, causing her feet to slip. She landed on her bottom but did not experience pain. She is currently using a walker for mobility, which she was not using prior to the fall.  No current pain in the left hip, stating that it 'hurt so badly for a while and then it went away.' She is not taking any pain medication for the hip at this time. She has a history of chronic back pain and arthritis in her back, hips, and knees, for which she takes oxycodone as needed.  She is undergoing therapy and has already had a session this morning, which she reports is going well. She is using a walker for mobility.  She has a history of high blood pressure, which is well controlled on her current medication regimen.  She has a history of irritable  bowel syndrome (IBS), which causes diarrhea. She was up at 4 AM due to diarrhea and finds relief with Imodium, which she takes as needed. She monitors her diet to avoid foods that exacerbate her symptoms.  She experienced significant hand swelling recently, which has improved but still shows some residual fluid. The cause of the swelling was not determined. No leg swelling currently, although her left leg is wrapped due to the fracture.   Past Medical History:  Diagnosis Date   Arthritis    Elevated lipids    Encounter for diagnostic colonoscopy due to change in bowel habits    Hypertension    Scoliosis    Past Surgical History:  Procedure Laterality Date   CATARACT EXTRACTION     COLONOSCOPY WITH PROPOFOL N/A 11/23/2017   Procedure: COLONOSCOPY WITH PROPOFOL;  Surgeon: Scot Jun, MD;  Location: [facility] ENDOSCOPY;  Service: Endoscopy;  Laterality: N/A;   INTRAMEDULLARY (IM) NAIL INTERTROCHANTERIC Left 07/15/2023   Procedure: INTRAMEDULLARY (IM) NAIL INTERTROCHANTERIC;  Surgeon: Reinaldo Berber, MD;  Location: [facility] ORS;  Service: Orthopedics;  Laterality: Left;   TONSILLECTOMY      No Known Allergies  Outpatient Encounter Medications as of 08/13/2023  Medication Sig   acetaminophen (TYLENOL) 325 MG tablet Take 2 tablets (650 mg total) by mouth every 6 (six) hours as needed for mild pain (pain score 1-3), fever or headache.   alendronate (FOSAMAX) 70 MG tablet Take 70 mg by mouth once a week. Take with  a full glass of water on an empty stomach.   amLODipine (NORVASC) 10 MG tablet Take 10 mg by mouth daily.   aspirin EC 81 MG tablet Take 81 mg by mouth daily.   BIOGAIA PROBIOTIC (BIOGAIA PROBIOTIC) LIQD Take by mouth daily at 8 pm.   bismuth subsalicylate (PEPTO BISMOL) 262 MG/15ML suspension Take 30 mLs by mouth every 4 (four) hours as needed.   calcium carbonate (TUMS - DOSED IN MG ELEMENTAL CALCIUM) 500 MG chewable tablet Chew 1 tablet by mouth daily.   cholecalciferol (VITAMIN  D3) 25 MCG (1000 UNIT) tablet Take 1,000 Units by mouth daily.   citalopram (CELEXA) 20 MG tablet Take 20 mg by mouth daily.   dicyclomine (BENTYL) 20 MG tablet Take 20 mg by mouth every 6 (six) hours.   enoxaparin (LOVENOX) 40 MG/0.4ML injection Inject 0.4 mLs (40 mg total) into the skin daily for 14 days.   lisinopril (PRINIVIL,ZESTRIL) 10 MG tablet Take 10 mg by mouth daily.   loperamide (IMODIUM) 2 MG capsule Take 2 mg by mouth as needed for diarrhea or loose stools.   methocarbamol (ROBAXIN) 500 MG tablet Take 250-500 mg by mouth 2 (two) times daily as needed.   multivitamin-lutein (OCUVITE-LUTEIN) CAPS capsule Take 2 capsules by mouth daily.   Omega-3 Fatty Acids (FISH OIL CONCENTRATE PO) Take 1 capsule by mouth daily.   oxyCODONE (OXY IR/ROXICODONE) 5 MG immediate release tablet Take 0.5-1 tablets (2.5-5 mg total) by mouth every 6 (six) hours as needed for moderate pain (pain score 4-6) or severe pain (pain score 7-10).   vitamin E (VITAMIN E) 1000 UNIT capsule Take 1,000 Units by mouth daily.   No facility-administered encounter medications on file as of 08/13/2023.    Review of Systems  Constitutional:  Negative for activity change, appetite change, fatigue and unexpected weight change.  HENT:  Positive for hearing loss. Negative for congestion.   Eyes: Negative.   Respiratory:  Negative for cough and shortness of breath.   Cardiovascular:  Negative for chest pain, palpitations and leg swelling.  Gastrointestinal:  Positive for diarrhea. Negative for abdominal pain and constipation.  Genitourinary:  Negative for difficulty urinating and dysuria.  Musculoskeletal:  Positive for back pain. Negative for arthralgias and myalgias.  Skin:  Negative for color change and wound.  Neurological:  Positive for weakness. Negative for dizziness.  Psychiatric/Behavioral:  Positive for confusion. Negative for agitation and behavioral problems.     Immunization History  Administered Date(s)  Administered   Fluzone Influenza virus vaccine,trivalent (IIV3), split virus 01/25/2011, 02/24/2012   Influenza Inj Mdck Quad Pf 02/18/2016   Influenza-Unspecified 02/13/2014, 03/01/2015, 01/21/2018   PFIZER Comirnaty(Gray Top)Covid-19 Tri-Sucrose Vaccine 09/18/2020   Pneumococcal Conjugate-13 10/31/2014   Pneumococcal Polysaccharide-23 06/11/2009, 02/19/2017   Respiratory Syncytial Virus Vaccine,Recomb Aduvanted(Arexvy) 02/24/2022   Zoster Recombinant(Shingrix) 11/10/2017, 01/21/2018   Pertinent  Health Maintenance Due  Topic Date Due   DEXA SCAN  Never done   INFLUENZA VACCINE  12/25/2022       No data to display         Functional Status Survey:    Vitals:   08/13/23 1204  BP: 124/66  Pulse: 66  Resp: 20  Temp: (!) 97.5 F (36.4 C)  SpO2: 96%  Weight: 100 lb 6.4 oz (45.5 kg)  Height: 4\' 11"  (1.499 m)   Body mass index is 20.28 kg/m. Physical Exam Constitutional:      General: She is not in acute distress.    Appearance: She is well-developed.  She is not diaphoretic.  HENT:     Head: Normocephalic and atraumatic.     Mouth/Throat:     Pharynx: No oropharyngeal exudate.  Eyes:     Conjunctiva/sclera: Conjunctivae normal.     Pupils: Pupils are equal, round, and reactive to light.  Cardiovascular:     Rate and Rhythm: Normal rate and regular rhythm.     Heart sounds: Normal heart sounds.  Pulmonary:     Effort: Pulmonary effort is normal.     Breath sounds: Normal breath sounds.  Abdominal:     General: Bowel sounds are normal.     Palpations: Abdomen is soft.  Musculoskeletal:     Cervical back: Normal range of motion and neck supple.     Right lower leg: No edema.     Left lower leg: No edema.  Skin:    General: Skin is warm and dry.  Neurological:     Mental Status: She is alert.  Psychiatric:        Mood and Affect: Mood normal.     Labs reviewed: Recent Labs    07/16/23 0529 07/17/23 0553 07/18/23 0425 07/23/23 0000  NA 130* 131* 132*  130*  K 3.9 3.9 3.9 3.6  CL 96* 95* 97* 94*  CO2 28 28 28  26*  GLUCOSE 99 113* 103*  --   BUN 10 15 21 20   CREATININE 0.78 0.68 0.76 0.8  CALCIUM 8.3* 8.2* 8.2* 8.0*   Recent Labs    07/14/23 1655  AST 27  ALT 25  ALKPHOS 61  BILITOT 0.8  PROT 6.3*  ALBUMIN 3.7   Recent Labs    07/14/23 1655 07/15/23 0757 07/16/23 0529 07/17/23 0553 07/18/23 0425 07/23/23 0000  WBC 20.9* 10.5 10.1 10.3 10.0 18.4  NEUTROABS 18.6* 8.3* 7.6  --   --   --   HGB 10.6* 9.8* 8.9* 8.9* 8.3* 9.8*  HCT 30.7* 28.4* 26.4* 25.3* 24.7* 30*  MCV 84.1 84.8 86.0 84.6 86.1  --   PLT 337 302 291 288 328 690*   No results found for: "TSH" No results found for: "HGBA1C" No results found for: "CHOL", "HDL", "LDLCALC", "LDLDIRECT", "TRIG", "CHOLHDL"  Significant Diagnostic Results in last 30 days:  DG HIP UNILAT WITH PELVIS 2-3 VIEWS LEFT Result Date: 07/15/2023 CLINICAL DATA:  Elective surgery. EXAM: DG HIP (WITH OR WITHOUT PELVIS) 2-3V LEFT COMPARISON:  Preoperative imaging. FINDINGS: Five fluoroscopic spot views of the left hip submitted from the operating room. Femoral intramedullary nail with trans trochanteric and distal locking screw fixation traverse proximal femur fracture. Fluoroscopy time 1 minutes 42 seconds. Dose 14.4 mGy. IMPRESSION: Intraoperative fluoroscopy during proximal femur fracture fixation. Electronically Signed   By: Narda Rutherford M.D.   On: 07/15/2023 14:50   DG C-Arm 1-60 Min-No Report Result Date: 07/15/2023 Fluoroscopy was utilized by the requesting physician.  No radiographic interpretation.   DG Femur Portable Min 2 Views Left Result Date: 07/14/2023 CLINICAL DATA:  Patient with known left intertrochanteric femoral fracture. Full length femur images requested by ordering provider. EXAM: LEFT FEMUR PORTABLE 2 VIEWS COMPARISON:  Left hip radiographs dated 07/14/2023 5:20 p.m. FINDINGS: A displaced and comminuted intertrochanteric fracture of the left proximal femur is again  noted. No additional fracture identified. IMPRESSION: Redemonstrated displaced and comminuted intertrochanteric fracture of the left proximal femur. No additional fracture identified. Electronically Signed   By: Hart Robinsons M.D.   On: 07/14/2023 21:09   DG Chest 1 View Result Date: 07/14/2023 CLINICAL DATA:  Fall, initial encounter, left hip fracture. EXAM: CHEST  1 VIEW COMPARISON:  None Available. FINDINGS: The cardiomediastinal contours are normal. Aortic atherosclerosis. Pulmonary vasculature is normal. No consolidation, pleural effusion, or pneumothorax. No acute osseous abnormalities are seen. IMPRESSION: No active disease. Electronically Signed   By: Narda Rutherford M.D.   On: 07/14/2023 18:53   DG Hip Unilat W or Wo Pelvis 2-3 Views Left Result Date: 07/14/2023 CLINICAL DATA:  Status post fall with pain, shortening and rotation. EXAM: DG HIP (WITH OR WITHOUT PELVIS) 2-3V LEFT COMPARISON:  None Available. FINDINGS: Displaced and comminuted intertrochanteric left proximal femur fracture. Apex lateral angulation with mild proximal migration of the femoral shaft. The femoral head is seated, no hip dislocation. 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 Soft tissue edema is seen lateral to the fracture site. IMPRESSION: Displaced and comminuted intertrochanteric left proximal femur fracture. Electronically Signed   By: Narda Rutherford M.D.   On: 07/14/2023 18:53   CT Head Wo Contrast Result Date: 07/14/2023 CLINICAL DATA:  Fall with trauma to the head and neck EXAM: CT HEAD WITHOUT CONTRAST CT CERVICAL SPINE WITHOUT CONTRAST TECHNIQUE: Multidetector CT imaging of the head and cervical spine was performed following the standard protocol without intravenous contrast. Multiplanar CT image reconstructions of the cervical spine were also generated. RADIATION DOSE REDUCTION: This exam was performed according to the departmental dose-optimization program which includes automated exposure control,  adjustment of the mA and/or kV according to patient size and/or use of iterative reconstruction technique. COMPARISON:  None Available. FINDINGS: CT HEAD FINDINGS Brain: Age related volume loss without subjective lobar predominance. Chronic small-vessel ischemic changes of the cerebral hemispheric white matter without evidence of acute supra tentorial stroke. Benign calcification within the right pons which could be an insignificant idiopathic calcification or could relate to a small cavernoma. Neither should be of clinical relevance. No evidence of acute hemorrhage, mass, hydrocephalus or extra-axial collection. Vascular: There is atherosclerotic calcification of the major vessels at the base of the brain. Skull: Negative Sinuses/Orbits: Clear/normal Other: None CT CERVICAL SPINE FINDINGS Alignment: Mild scoliosis. Skull base and vertebrae: No regional fracture or focal destructive lesion. Minimal depression of the superior endplates of T2 and T3, favored to be old. Soft tissues and spinal canal: No traumatic soft tissue finding. No visible mass or adenopathy. Disc levels: Chronic degenerative spondylosis from C3-4 through C6-7. Mild osteophytic encroachment upon the canal or foramina. No severe or advanced disease. Upper chest: Scarring at the lung apices. Other: None IMPRESSION: HEAD CT: No acute or traumatic finding. Age related volume loss and chronic small-vessel ischemic changes of the white matter. CERVICAL SPINE CT: No acute or traumatic finding. Chronic degenerative spondylosis from C3-4 through C6-7. Minimal depression of the superior endplates of T2 and T3, favored to be old. Electronically Signed   By: Paulina Fusi M.D.   On: 07/14/2023 17:34   CT Cervical Spine Wo Contrast Result Date: 07/14/2023 CLINICAL DATA:  Fall with trauma to the head and neck EXAM: CT HEAD WITHOUT CONTRAST CT CERVICAL SPINE WITHOUT CONTRAST TECHNIQUE: Multidetector CT imaging of the head and cervical spine was performed  following the standard protocol without intravenous contrast. Multiplanar CT image reconstructions of the cervical spine were also generated. RADIATION DOSE REDUCTION: This exam was performed according to the departmental dose-optimization program which includes automated exposure control, adjustment of the mA and/or kV according to patient size and/or use of iterative reconstruction technique. COMPARISON:  None Available. FINDINGS: CT HEAD FINDINGS Brain: Age related volume loss  without subjective lobar predominance. Chronic small-vessel ischemic changes of the cerebral hemispheric white matter without evidence of acute supra tentorial stroke. Benign calcification within the right pons which could be an insignificant idiopathic calcification or could relate to a small cavernoma. Neither should be of clinical relevance. No evidence of acute hemorrhage, mass, hydrocephalus or extra-axial collection. Vascular: There is atherosclerotic calcification of the major vessels at the base of the brain. Skull: Negative Sinuses/Orbits: Clear/normal Other: None CT CERVICAL SPINE FINDINGS Alignment: Mild scoliosis. Skull base and vertebrae: No regional fracture or focal destructive lesion. Minimal depression of the superior endplates of T2 and T3, favored to be old. Soft tissues and spinal canal: No traumatic soft tissue finding. No visible mass or adenopathy. Disc levels: Chronic degenerative spondylosis from C3-4 through C6-7. Mild osteophytic encroachment upon the canal or foramina. No severe or advanced disease. Upper chest: Scarring at the lung apices. Other: None IMPRESSION: HEAD CT: No acute or traumatic finding. Age related volume loss and chronic small-vessel ischemic changes of the white matter. CERVICAL SPINE CT: No acute or traumatic finding. Chronic degenerative spondylosis from C3-4 through C6-7. Minimal depression of the superior endplates of T2 and T3, favored to be old. Electronically Signed   By: Paulina Fusi  M.D.   On: 07/14/2023 17:34    Assessment/Plan No problem-specific Assessment & Plan notes found for this encounter.  1. Displaced intertrochanteric fracture of left femur (HCC) (Primary) Left Hip Fracture after fall on 07/14/23 s/p IM nail on 2/19 Post-surgical incision healing well. Swelling managed. Mobility requires walker. Discharge planning for assisted living due to fall risk and no one living in the home. - working with SW to Arrange assisted living placement. - Continue walker use for mobility. - incision is well healed.  - Follow-up with orthopedic specialist as needed.  Spinal stenosis, lumbar region without neurogenic claudication Pain managed with oxycodone. - Continue oxycodone as needed.  Hand Swelling Significant swelling improved, residual fluid remain.  Irritable Bowel Syndrome (IBS) Symptoms managed with Imodium. Dietary caution advised. - encouraged her to ask for Imodium for acute diarrhea. - Advise continued dietary monitoring.  Other iron deficiency anemia Continue iron supplement   Osteoporosis, unspecified osteoporosis type, unspecified pathological fracture presence Continues on fosamax weekly   6. Hyponatremia -chronic, continue to follow outpatient.   7. Hypertension -Blood pressure well controlled, goal bp <140/90 Continue current medications and dietary modifications follow metabolic panel  Brother working with SW for placement to AL, does not feel safe with her moving back home.  Continue PT/OT at this time.    Janene Harvey. Biagio Borg [facility] [phone]

## 2023-08-13 NOTE — Progress Notes (Deleted)
 Location:  Other [place].  Nursing Home Room Number: [facility] 111A Place of Service:  SNF (31) Abbey Chatters, NP  PCP: Jerl Mina, MD  Patient Care Team: Jerl Mina, MD as PCP - General (Family Medicine)  Extended Emergency Contact Information Primary Emergency Contact: Smith,chuck Mobile Phone: [phone] Relation: Brother Secondary Emergency Contact: smith,beverly Mobile Phone: [phone] Relation: Sister  Goals of care: Advanced Directive information    07/15/2023   12:06 PM  Advanced Directives  Does Patient Have a Medical Advance Directive? Yes  Type of Advance Directive Living will  Does patient want to make changes to medical advance directive? No - Patient declined     Chief Complaint  Patient presents with   Discharge Note    Discharge.     HPI:  Pt is a 89 y.o. female seen today for Discharge.    Past Medical History:  Diagnosis Date   Arthritis    Elevated lipids    Encounter for diagnostic colonoscopy due to change in bowel habits    Hypertension    Scoliosis    Past Surgical History:  Procedure Laterality Date   CATARACT EXTRACTION     COLONOSCOPY WITH PROPOFOL N/A 11/23/2017   Procedure: COLONOSCOPY WITH PROPOFOL;  Surgeon: Scot Jun, MD;  Location: [facility] ENDOSCOPY;  Service: Endoscopy;  Laterality: N/A;   INTRAMEDULLARY (IM) NAIL INTERTROCHANTERIC Left 07/15/2023   Procedure: INTRAMEDULLARY (IM) NAIL INTERTROCHANTERIC;  Surgeon: Reinaldo Berber, MD;  Location: [facility] ORS;  Service: Orthopedics;  Laterality: Left;   TONSILLECTOMY      No Known Allergies  Outpatient Encounter Medications as of 08/13/2023  Medication Sig   acetaminophen (TYLENOL) 325 MG tablet Take 2 tablets (650 mg total) by mouth every 6 (six) hours as needed for mild pain (pain score 1-3), fever or headache.   alendronate (FOSAMAX) 70 MG tablet Take 70 mg by mouth once a week. Take with a full glass of water on an empty stomach.   amLODipine  (NORVASC) 10 MG tablet Take 10 mg by mouth daily.   aspirin EC 81 MG tablet Take 81 mg by mouth daily.   BIOGAIA PROBIOTIC (BIOGAIA PROBIOTIC) LIQD Take 5 drops by mouth daily at 8 pm.   bismuth subsalicylate (PEPTO BISMOL) 262 MG/15ML suspension Take 30 mLs by mouth every 4 (four) hours as needed.   calcium carbonate (TUMS - DOSED IN MG ELEMENTAL CALCIUM) 500 MG chewable tablet Chew 1 tablet by mouth daily.   cholecalciferol (VITAMIN D3) 25 MCG (1000 UNIT) tablet Take 1,000 Units by mouth daily.   citalopram (CELEXA) 20 MG tablet Take 20 mg by mouth daily.   dicyclomine (BENTYL) 20 MG tablet Take 20 mg by mouth every 6 (six) hours.   lisinopril (PRINIVIL,ZESTRIL) 10 MG tablet Take 10 mg by mouth daily.   loperamide (IMODIUM) 2 MG capsule Take 2 mg by mouth as needed for diarrhea or loose stools.   methocarbamol (ROBAXIN) 500 MG tablet Take 250-500 mg by mouth 2 (two) times daily as needed.   multivitamin-lutein (OCUVITE-LUTEIN) CAPS capsule Take 2 capsules by mouth daily.   Omega-3 Fatty Acids (FISH OIL CONCENTRATE PO) Take 1 capsule by mouth daily.   oxyCODONE (OXY IR/ROXICODONE) 5 MG immediate release tablet Take 0.5-1 tablets (2.5-5 mg total) by mouth every 6 (six) hours as needed for moderate pain (pain score 4-6) or severe pain (pain score 7-10).   vitamin E (VITAMIN E) 1000 UNIT capsule Take 1,000 Units by mouth daily.   enoxaparin (LOVENOX) 40 MG/0.4ML injection  Inject 0.4 mLs (40 mg total) into the skin daily for 14 days.   No facility-administered encounter medications on file as of 08/13/2023.    Review of Systems ***  Immunization History  Administered Date(s) Administered   Fluzone Influenza virus vaccine,trivalent (IIV3), split virus 01/25/2011, 02/24/2012   Influenza Inj Mdck Quad Pf 02/18/2016   Influenza-Unspecified 02/13/2014, 03/01/2015, 01/21/2018   PFIZER Comirnaty(Gray Top)Covid-19 Tri-Sucrose Vaccine 09/18/2020   Pneumococcal Conjugate-13 10/31/2014   Pneumococcal  Polysaccharide-23 06/11/2009, 02/19/2017   Respiratory Syncytial Virus Vaccine,Recomb Aduvanted(Arexvy) 02/24/2022   Unspecified SARS-COV-2 Vaccination 06/18/2019, 07/09/2019, 02/16/2020   Zoster Recombinant(Shingrix) 11/10/2017, 01/21/2018   Pertinent  Health Maintenance Due  Topic Date Due   DEXA SCAN  Never done   INFLUENZA VACCINE  12/25/2022       No data to display         Functional Status Survey:    Vitals:   08/13/23 1204  BP: 124/66  Pulse: 66  Resp: 20  Temp: (!) 97.5 F (36.4 C)  SpO2: 96%  Weight: 100 lb 6.4 oz (45.5 kg)  Height: 4\' 11"  (1.499 m)   Body mass index is 20.28 kg/m. Physical Exam***  Labs reviewed: Recent Labs    07/16/23 0529 07/17/23 0553 07/18/23 0425 07/23/23 0000  NA 130* 131* 132* 130*  K 3.9 3.9 3.9 3.6  CL 96* 95* 97* 94*  CO2 28 28 28  26*  GLUCOSE 99 113* 103*  --   BUN 10 15 21 20   CREATININE 0.78 0.68 0.76 0.8  CALCIUM 8.3* 8.2* 8.2* 8.0*   Recent Labs    07/14/23 1655  AST 27  ALT 25  ALKPHOS 61  BILITOT 0.8  PROT 6.3*  ALBUMIN 3.7   Recent Labs    07/14/23 1655 07/15/23 0757 07/16/23 0529 07/17/23 0553 07/18/23 0425 07/23/23 0000  WBC 20.9* 10.5 10.1 10.3 10.0 18.4  NEUTROABS 18.6* 8.3* 7.6  --   --   --   HGB 10.6* 9.8* 8.9* 8.9* 8.3* 9.8*  HCT 30.7* 28.4* 26.4* 25.3* 24.7* 30*  MCV 84.1 84.8 86.0 84.6 86.1  --   PLT 337 302 291 288 328 690*   No results found for: "TSH" No results found for: "HGBA1C" No results found for: "CHOL", "HDL", "LDLCALC", "LDLDIRECT", "TRIG", "CHOLHDL"  Significant Diagnostic Results in last 30 days:  DG HIP UNILAT WITH PELVIS 2-3 VIEWS LEFT Result Date: 07/15/2023 CLINICAL DATA:  Elective surgery. EXAM: DG HIP (WITH OR WITHOUT PELVIS) 2-3V LEFT COMPARISON:  Preoperative imaging. FINDINGS: Five fluoroscopic spot views of the left hip submitted from the operating room. Femoral intramedullary nail with trans trochanteric and distal locking screw fixation traverse proximal  femur fracture. Fluoroscopy time 1 minutes 42 seconds. Dose 14.4 mGy. IMPRESSION: Intraoperative fluoroscopy during proximal femur fracture fixation. Electronically Signed   By: Narda Rutherford M.D.   On: 07/15/2023 14:50   DG C-Arm 1-60 Min-No Report Result Date: 07/15/2023 Fluoroscopy was utilized by the requesting physician.  No radiographic interpretation.   DG Femur Portable Min 2 Views Left Result Date: 07/14/2023 CLINICAL DATA:  Patient with known left intertrochanteric femoral fracture. Full length femur images requested by ordering provider. EXAM: LEFT FEMUR PORTABLE 2 VIEWS COMPARISON:  Left hip radiographs dated 07/14/2023 5:20 p.m. FINDINGS: A displaced and comminuted intertrochanteric fracture of the left proximal femur is again noted. No additional fracture identified. IMPRESSION: Redemonstrated displaced and comminuted intertrochanteric fracture of the left proximal femur. No additional fracture identified. Electronically Signed   By: Maryan Char.D.  On: 07/14/2023 21:09   DG Chest 1 View Result Date: 07/14/2023 CLINICAL DATA:  Fall, initial encounter, left hip fracture. EXAM: CHEST  1 VIEW COMPARISON:  None Available. FINDINGS: The cardiomediastinal contours are normal. Aortic atherosclerosis. Pulmonary vasculature is normal. No consolidation, pleural effusion, or pneumothorax. No acute osseous abnormalities are seen. IMPRESSION: No active disease. Electronically Signed   By: Narda Rutherford M.D.   On: 07/14/2023 18:53   DG Hip Unilat W or Wo Pelvis 2-3 Views Left Result Date: 07/14/2023 CLINICAL DATA:  Status post fall with pain, shortening and rotation. EXAM: DG HIP (WITH OR WITHOUT PELVIS) 2-3V LEFT COMPARISON:  None Available. FINDINGS: Displaced and comminuted intertrochanteric left proximal femur fracture. Apex lateral angulation with mild proximal migration of the femoral shaft. The femoral head is seated, no hip dislocation. 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 Soft tissue edema is seen lateral to the fracture site. IMPRESSION: Displaced and comminuted intertrochanteric left proximal femur fracture. Electronically Signed   By: Narda Rutherford M.D.   On: 07/14/2023 18:53   CT Head Wo Contrast Result Date: 07/14/2023 CLINICAL DATA:  Fall with trauma to the head and neck EXAM: CT HEAD WITHOUT CONTRAST CT CERVICAL SPINE WITHOUT CONTRAST TECHNIQUE: Multidetector CT imaging of the head and cervical spine was performed following the standard protocol without intravenous contrast. Multiplanar CT image reconstructions of the cervical spine were also generated. RADIATION DOSE REDUCTION: This exam was performed according to the departmental dose-optimization program which includes automated exposure control, adjustment of the mA and/or kV according to patient size and/or use of iterative reconstruction technique. COMPARISON:  None Available. FINDINGS: CT HEAD FINDINGS Brain: Age related volume loss without subjective lobar predominance. Chronic small-vessel ischemic changes of the cerebral hemispheric white matter without evidence of acute supra tentorial stroke. Benign calcification within the right pons which could be an insignificant idiopathic calcification or could relate to a small cavernoma. Neither should be of clinical relevance. No evidence of acute hemorrhage, mass, hydrocephalus or extra-axial collection. Vascular: There is atherosclerotic calcification of the major vessels at the base of the brain. Skull: Negative Sinuses/Orbits: Clear/normal Other: None CT CERVICAL SPINE FINDINGS Alignment: Mild scoliosis. Skull base and vertebrae: No regional fracture or focal destructive lesion. Minimal depression of the superior endplates of T2 and T3, favored to be old. Soft tissues and spinal canal: No traumatic soft tissue finding. No visible mass or adenopathy. Disc levels: Chronic degenerative spondylosis from C3-4 through C6-7. Mild osteophytic encroachment  upon the canal or foramina. No severe or advanced disease. Upper chest: Scarring at the lung apices. Other: None IMPRESSION: HEAD CT: No acute or traumatic finding. Age related volume loss and chronic small-vessel ischemic changes of the white matter. CERVICAL SPINE CT: No acute or traumatic finding. Chronic degenerative spondylosis from C3-4 through C6-7. Minimal depression of the superior endplates of T2 and T3, favored to be old. Electronically Signed   By: Paulina Fusi M.D.   On: 07/14/2023 17:34   CT Cervical Spine Wo Contrast Result Date: 07/14/2023 CLINICAL DATA:  Fall with trauma to the head and neck EXAM: CT HEAD WITHOUT CONTRAST CT CERVICAL SPINE WITHOUT CONTRAST TECHNIQUE: Multidetector CT imaging of the head and cervical spine was performed following the standard protocol without intravenous contrast. Multiplanar CT image reconstructions of the cervical spine were also generated. RADIATION DOSE REDUCTION: This exam was performed according to the departmental dose-optimization program which includes automated exposure control, adjustment of the mA and/or kV according to patient size and/or use of iterative  reconstruction technique. COMPARISON:  None Available. FINDINGS: CT HEAD FINDINGS Brain: Age related volume loss without subjective lobar predominance. Chronic small-vessel ischemic changes of the cerebral hemispheric white matter without evidence of acute supra tentorial stroke. Benign calcification within the right pons which could be an insignificant idiopathic calcification or could relate to a small cavernoma. Neither should be of clinical relevance. No evidence of acute hemorrhage, mass, hydrocephalus or extra-axial collection. Vascular: There is atherosclerotic calcification of the major vessels at the base of the brain. Skull: Negative Sinuses/Orbits: Clear/normal Other: None CT CERVICAL SPINE FINDINGS Alignment: Mild scoliosis. Skull base and vertebrae: No regional fracture or focal  destructive lesion. Minimal depression of the superior endplates of T2 and T3, favored to be old. Soft tissues and spinal canal: No traumatic soft tissue finding. No visible mass or adenopathy. Disc levels: Chronic degenerative spondylosis from C3-4 through C6-7. Mild osteophytic encroachment upon the canal or foramina. No severe or advanced disease. Upper chest: Scarring at the lung apices. Other: None IMPRESSION: HEAD CT: No acute or traumatic finding. Age related volume loss and chronic small-vessel ischemic changes of the white matter. CERVICAL SPINE CT: No acute or traumatic finding. Chronic degenerative spondylosis from C3-4 through C6-7. Minimal depression of the superior endplates of T2 and T3, favored to be old. Electronically Signed   By: Paulina Fusi M.D.   On: 07/14/2023 17:34    Assessment/Plan No problem-specific Assessment & Plan notes found for this encounter.     Janene Harvey. [facility]ag[facility] [phone]

## 2023-08-19 ENCOUNTER — Non-Acute Institutional Stay (SKILLED_NURSING_FACILITY): Payer: Self-pay | Admitting: Student

## 2023-08-19 ENCOUNTER — Encounter: Payer: Self-pay | Admitting: Student

## 2023-08-19 DIAGNOSIS — R600 Localized edema: Secondary | ICD-10-CM | POA: Diagnosis not present

## 2023-08-19 DIAGNOSIS — E46 Unspecified protein-calorie malnutrition: Secondary | ICD-10-CM | POA: Diagnosis not present

## 2023-08-19 DIAGNOSIS — K529 Noninfective gastroenteritis and colitis, unspecified: Secondary | ICD-10-CM

## 2023-08-19 NOTE — Progress Notes (Unsigned)
 Location:  Other [place].  Nursing Home Room Number: [facility] 111A Place of Service:  SNF [id number]) Provider:  Dr. Earnestine Mealing  PCP: Jerl Mina, MD  Patient Care Team: Jerl Mina, MD as PCP - General (Family Medicine)  Extended Emergency Contact Information Primary Emergency Contact: Smith,chuck Mobile Phone: [phone] Relation: Brother Secondary Emergency Contact: smith,beverly Mobile Phone: [phone] Relation: Sister  Code Status:  Full Code Goals of care: Advanced Directive information    08/19/2023    8:52 AM  Advanced Directives  Does Patient Have a Medical Advance Directive? No  Would patient like information on creating a medical advance directive? No - Patient declined     Chief Complaint  Patient presents with   Diarrhea    Diarrhea    HPI:  Pt is a 89 y.o. female seen today for an acute visit for Diarrhea  History of Present Illness The patient presents with diarrhea management issues. Her brother and sister are involved in her care.  She has ongoing issues with diarrhea, which she manages with Imodium. If taken promptly, Imodium is effective, but delays worsen the situation. She previously tried Bentyl, which was ineffective.  She experiences persistent swelling in her leg, while the swelling in her arm has improved significantly.  She typically eats two meals a day, breakfast and dinner, and prefers Boost over Ensure for additional protein intake due to taste preferences.  She is preparing for a transition to assisted living and is uncertain about the exact timing of her acceptance.  Medications - Bentyl (not effective for diarrhea)  Past Medical History:  Diagnosis Date   Arthritis    Elevated lipids    Encounter for diagnostic colonoscopy due to change in bowel habits    Hypertension    Scoliosis    Past Surgical History:  Procedure Laterality Date   CATARACT EXTRACTION     COLONOSCOPY WITH PROPOFOL N/A 11/23/2017    Procedure: COLONOSCOPY WITH PROPOFOL;  Surgeon: Scot Jun, MD;  Location: [facility] ENDOSCOPY;  Service: Endoscopy;  Laterality: N/A;   INTRAMEDULLARY (IM) NAIL INTERTROCHANTERIC Left 07/15/2023   Procedure: INTRAMEDULLARY (IM) NAIL INTERTROCHANTERIC;  Surgeon: Reinaldo Berber, MD;  Location: [facility] ORS;  Service: Orthopedics;  Laterality: Left;   TONSILLECTOMY      No Known Allergies  Outpatient Encounter Medications as of 08/19/2023  Medication Sig   acetaminophen (TYLENOL) 325 MG tablet Take 2 tablets (650 mg total) by mouth every 6 (six) hours as needed for mild pain (pain score 1-3), fever or headache.   alendronate (FOSAMAX) 70 MG tablet Take 70 mg by mouth once a week. Take with a full glass of water on an empty stomach.   amLODipine (NORVASC) 10 MG tablet Take 10 mg by mouth daily.   aspirin EC 81 MG tablet Take 81 mg by mouth daily.   BIOGAIA PROBIOTIC (BIOGAIA PROBIOTIC) LIQD Take 5 drops by mouth daily at 8 pm.   bismuth subsalicylate (PEPTO BISMOL) 262 MG/15ML suspension Take 30 mLs by mouth every 4 (four) hours as needed.   calcium carbonate (TUMS - DOSED IN MG ELEMENTAL CALCIUM) 500 MG chewable tablet Chew 1 tablet by mouth daily.   cholecalciferol (VITAMIN D3) 25 MCG (1000 UNIT) tablet Take 1,000 Units by mouth daily.   citalopram (CELEXA) 20 MG tablet Take 20 mg by mouth daily.   dicyclomine (BENTYL) 20 MG tablet Take 20 mg by mouth every 6 (six) hours.   lisinopril (PRINIVIL,ZESTRIL) 10 MG tablet Take 10 mg by mouth daily.  loperamide (IMODIUM) 2 MG capsule Take 2 mg by mouth as needed for diarrhea or loose stools.   methocarbamol (ROBAXIN) 500 MG tablet Take 250-500 mg by mouth 2 (two) times daily as needed.   multivitamin-lutein (OCUVITE-LUTEIN) CAPS capsule Take 2 capsules by mouth daily.   Omega-3 Fatty Acids (FISH OIL CONCENTRATE PO) Take 1 capsule by mouth daily.   oxyCODONE (OXY IR/ROXICODONE) 5 MG immediate release tablet Take 0.5-1 tablets (2.5-5 mg total) by mouth  every 6 (six) hours as needed for moderate pain (pain score 4-6) or severe pain (pain score 7-10).   vitamin E (VITAMIN E) 1000 UNIT capsule Take 1,000 Units by mouth daily.   enoxaparin (LOVENOX) 40 MG/0.4ML injection Inject 0.4 mLs (40 mg total) into the skin daily for 14 days.   No facility-administered encounter medications on file as of 08/19/2023.    Review of Systems  Immunization History  Administered Date(s) Administered   Fluzone Influenza virus vaccine,trivalent (IIV3), split virus 01/25/2011, 02/24/2012   Influenza Inj Mdck Quad Pf 02/18/2016   Influenza-Unspecified 02/13/2014, 03/01/2015, 01/21/2018   PFIZER Comirnaty(Gray Top)Covid-19 Tri-Sucrose Vaccine 09/18/2020   Pneumococcal Conjugate-13 10/31/2014   Pneumococcal Polysaccharide-23 06/11/2009, 02/19/2017   Respiratory Syncytial Virus Vaccine,Recomb Aduvanted(Arexvy) 02/24/2022   Unspecified SARS-COV-2 Vaccination 06/18/2019, 07/09/2019, 02/16/2020   Zoster Recombinant(Shingrix) 11/10/2017, 01/21/2018   Pertinent  Health Maintenance Due  Topic Date Due   DEXA SCAN  Never done   INFLUENZA VACCINE  12/25/2022       No data to display         Functional Status Survey:    Vitals:   08/19/23 0846  BP: 138/69  Pulse: 70  Resp: 18  Temp: 97.6 F (36.4 C)  SpO2: 97%  Weight: 100 lb 6.4 oz (45.5 kg)  Height: 4\' 11"  (1.499 m)   Body mass index is 20.28 kg/m. Physical Exam Constitutional:      Appearance: Normal appearance.  Cardiovascular:     Rate and Rhythm: Normal rate and regular rhythm.     Pulses: Normal pulses.     Heart sounds: Normal heart sounds.  Pulmonary:     Effort: Pulmonary effort is normal.  Abdominal:     General: Abdomen is flat. Bowel sounds are normal.     Palpations: Abdomen is soft.  Musculoskeletal:        General: Swelling present. No tenderness.  Skin:    General: Skin is warm and dry.  Neurological:     Mental Status: She is alert and oriented to person, place, and  time.     Gait: Gait normal.  Psychiatric:        Mood and Affect: Mood normal.     Labs reviewed: Recent Labs    07/16/23 0529 07/17/23 0553 07/18/23 0425 07/23/23 0000  NA 130* 131* 132* 130*  K 3.9 3.9 3.9 3.6  CL 96* 95* 97* 94*  CO2 28 28 28  26*  GLUCOSE 99 113* 103*  --   BUN 10 15 21 20   CREATININE 0.78 0.68 0.76 0.8  CALCIUM 8.3* 8.2* 8.2* 8.0*   Recent Labs    07/14/23 1655  AST 27  ALT 25  ALKPHOS 61  BILITOT 0.8  PROT 6.3*  ALBUMIN 3.7   Recent Labs    07/14/23 1655 07/15/23 0757 07/16/23 0529 07/17/23 0553 07/18/23 0425 07/23/23 0000  WBC 20.9* 10.5 10.1 10.3 10.0 18.4  NEUTROABS 18.6* 8.3* 7.6  --   --   --   HGB 10.6* 9.8* 8.9* 8.9* 8.3*  9.8*  HCT 30.7* 28.4* 26.4* 25.3* 24.7* 30*  MCV 84.1 84.8 86.0 84.6 86.1  --   PLT 337 302 291 288 328 690*   No results found for: "TSH" No results found for: "HGBA1C" No results found for: "CHOL", "HDL", "LDLCALC", "LDLDIRECT", "TRIG", "CHOLHDL"  Significant Diagnostic Results in last 30 days:  No results found.  Assessment/Plan Chronic Diarrhea   Chronic diarrhea previously managed with Bentyl was ineffective. Imodium has been effective but requires monitoring due to potential cardiac side effects. Alternative medications with fewer side effects were discussed and she is open to trying new options.   - Administer Imodium 2 mg before each meal   - Monitor cardiac function due to potential side effects of Imodium   - Consider trial of alternative medication for diarrhea management for one week   - Reassess diarrhea management in two weeks    Leg Swelling   Persistent leg swelling reported, with improvement in arm swelling. The cause of leg swelling was not discussed in detail.  - will need to discontinue oxycodone and muscle relaxer prior to transition to [place]    Weight loss, protein deficiency Consumes two meals a day, breakfast and dinner, skipping lunch. Discussed the importance of protein intake,  especially if skipping meals. Prefers Boost over Ensure for nutritional supplementation.   - Encourage protein intake with each meal   - Consider Boost as a midday nutritional supplement if lunch is skipped    Assisted Living Transition   Transitioning to assisted living with a requirement to attend meals twice a day in the dining room.   - Ensure attendance at meals twice a day in the dining room at assisted living facility  Family/ staff Communication: nursing  Labs/tests ordered:  none

## 2023-08-20 ENCOUNTER — Encounter: Payer: Self-pay | Admitting: Student

## 2023-08-20 DIAGNOSIS — K529 Noninfective gastroenteritis and colitis, unspecified: Secondary | ICD-10-CM | POA: Insufficient documentation

## 2023-08-20 DIAGNOSIS — R600 Localized edema: Secondary | ICD-10-CM | POA: Insufficient documentation

## 2023-09-14 ENCOUNTER — Non-Acute Institutional Stay: Payer: Self-pay | Admitting: Student

## 2023-09-14 DIAGNOSIS — S72142A Displaced intertrochanteric fracture of left femur, initial encounter for closed fracture: Secondary | ICD-10-CM

## 2023-09-14 DIAGNOSIS — M48061 Spinal stenosis, lumbar region without neurogenic claudication: Secondary | ICD-10-CM

## 2023-09-14 DIAGNOSIS — I10 Essential (primary) hypertension: Secondary | ICD-10-CM

## 2023-09-14 DIAGNOSIS — E871 Hypo-osmolality and hyponatremia: Secondary | ICD-10-CM | POA: Diagnosis not present

## 2023-09-14 DIAGNOSIS — E440 Moderate protein-calorie malnutrition: Secondary | ICD-10-CM

## 2023-09-14 DIAGNOSIS — R600 Localized edema: Secondary | ICD-10-CM

## 2023-09-14 MED ORDER — OXYCODONE HCL 5 MG PO TABS: 2.5000 mg | ORAL_TABLET | Freq: Two times a day (BID) | ORAL | 0 refills | Status: DC

## 2023-09-14 MED ORDER — HYDROCODONE-ACETAMINOPHEN 5-325 MG PO TABS: 1.0000 | ORAL_TABLET | Freq: Two times a day (BID) | ORAL | 0 refills | Status: DC

## 2023-09-14 MED ORDER — OXYCODONE HCL 5 MG PO TABS: 2.5000 mg | ORAL_TABLET | Freq: Four times a day (QID) | ORAL | 0 refills | Status: DC | PRN

## 2023-09-14 NOTE — Progress Notes (Unsigned)
 Provider:  Jann Melody Location:  Other Nursing Home Room Number: Larayne Platter Place of Service:  ALF (13)  PCP: Valrie Gehrig, MD Patient Care Team: Valrie Gehrig, MD as PCP - General (Family Medicine)  Extended Emergency Contact Information Primary Emergency Contact: Smith,chuck Mobile Phone: [phone] Relation: Brother Secondary Emergency Contact: smith,beverly Mobile Phone: [phone] Relation: Sister  Code Status: DNR Goals of Care: Advanced Directive information    08/19/2023    8:52 AM  Advanced Directives  Does Patient Have a Medical Advance Directive? No  Would patient like information on creating a medical advance directive? No - Patient declined      Chief Complaint  Patient presents with   Admission to Assisted Living    HPI: Patient is a 89 y.o. female seen today for admission to Assisted living.  Discussed the use of AI scribe software for clinical note transcription with the patient, who gave verbal consent to proceed.  History of Present Illness   History of Present Illness The patient presents with chronic leg swelling and drainage.  She has experienced chronic swelling in both legs for approximately eight to nine months, initially starting in the right upper arm, then moving to the left leg, and now affecting both legs. The swelling is accompanied by intermittent leakage, which she manages by elevating her legs and having them wrapped by her caregiver, Donata Fryer, after her scheduled baths on Mondays, Wednesdays, and Fridays.  She has a history of irritable bowel syndrome (IBS) characterized by diarrhea. She takes Imodium, but only receives it in the morning, despite needing it after breakfast and dinner. She finds Imodium more effective than previous medications tried for her IBS.  She experiences chronic back pain due to severe spinal stenosis. Previously, she was on oxycodone  for four years, taking it twice daily, which she found effective in  managing her pain. However, this medication was discontinued after her surgery, and she reports increased pain and difficulty sleeping without it.  She has a history of weight fluctuations, with a recent weight of 102 pounds. She was previously 95 pounds in March and 90 pounds before a fall. She attributes some of her current weight to fluid retention in her legs.  She uses a walker for mobility and feels steady with it. She has been working on improving her mobility and is able to take a few steps with assistance. No issues with urination or bowel movements aside from her IBS.  No shortness of breath at rest, but she experiences mild shortness of breath with walking. She has not had recent echocardiograms or seen a cardiologist before, but she has an upcoming appointment with a cardiovascular specialist.    Past Medical History:  Diagnosis Date   Arthritis    Elevated lipids    Encounter for diagnostic colonoscopy due to change in bowel habits    Hypertension    Scoliosis    Past Surgical History:  Procedure Laterality Date   CATARACT EXTRACTION     COLONOSCOPY WITH PROPOFOL  N/A 11/23/2017   Procedure: COLONOSCOPY WITH PROPOFOL ;  Surgeon: Cassie Click, MD;  Location: [facility] ENDOSCOPY;  Service: Endoscopy;  Laterality: N/A;   INTRAMEDULLARY (IM) NAIL INTERTROCHANTERIC Left 07/15/2023   Procedure: INTRAMEDULLARY (IM) NAIL INTERTROCHANTERIC;  Surgeon: Venus Ginsberg, MD;  Location: [facility] ORS;  Service: Orthopedics;  Laterality: Left;   TONSILLECTOMY      reports that she quit smoking about 12 years ago. Her smoking use included cigarettes. She has never used smokeless tobacco. She reports that she does  not drink alcohol and does not use drugs. Social History   Socioeconomic History   Marital status: Married    Spouse name: Not on file   Number of children: Not on file   Years of education: Not on file   Highest education level: Not on file  Occupational History   Not on file   Tobacco Use   Smoking status: Former    Current packs/day: 0.00    Types: Cigarettes    Quit date: 07/17/2011    Years since quitting: 12.1   Smokeless tobacco: Never  Vaping Use   Vaping status: Never Used  Substance and Sexual Activity   Alcohol use: Never   Drug use: Never   Sexual activity: Not on file  Other Topics Concern   Not on file  Social History Narrative   Not on file   Social Drivers of Health   Financial Resource Strain: Low Risk  (08/04/2023)   Received from [facility]   Overall Financial Resource Strain (CARDIA)    Difficulty of Paying Living Expenses: Not very hard  Food Insecurity: No Food Insecurity (08/04/2023)   Received from [facility]   Hunger Vital Sign    Worried About Running Out of Food in the Last Year: Never true    Ran Out of Food in the Last Year: Never true  Transportation Needs: No Transportation Needs (08/04/2023)   Received from [facility] - Transportation    In the past 12 months, has lack of transportation kept you from medical appointments or from getting medications?: No    Lack of Transportation (Non-Medical): No  Physical Activity: Not on file  Stress: Not on file  Social Connections: Moderately Integrated (07/14/2023)   Social Connection and Isolation Panel [NHANES]    Frequency of Communication with Friends and Family: More than three times a week    Frequency of Social Gatherings with Friends and Family: More than three times a week    Attends Religious Services: 1 to 4 times per year    Active Member of [employer] or Organizations: Yes    Attends Banker Meetings: 1 to 4 times per year    Marital Status: Widowed  Intimate Partner Violence: Not At Risk (07/14/2023)   Humiliation, Afraid, Rape, and Kick questionnaire    Fear of Current or Ex-Partner: No    Emotionally Abused: No    Physically Abused: No    Sexually Abused: No    Functional Status  Survey:    No family history on file.  Health Maintenance  Topic Date Due   DTaP/Tdap/Td (1 - Tdap) Never done   DEXA SCAN  Never done   COVID-19 Vaccine (5 - 2024-25 season) 01/25/2023   INFLUENZA VACCINE  12/25/2023   Medicare Annual Wellness (AWV)  06/29/2024   Pneumonia Vaccine 45+ Years old  Completed   Zoster Vaccines- Shingrix  Completed   HPV VACCINES  Aged Out   Meningococcal B Vaccine  Aged Out    No Known Allergies  Outpatient Encounter Medications as of 09/14/2023  Medication Sig   [DISCONTINUED] HYDROcodone -acetaminophen  (NORCO/VICODIN) 5-325 MG tablet Take 1 tablet by mouth in the morning and at bedtime.   acetaminophen  (TYLENOL ) 325 MG tablet Take 2 tablets (650 mg total) by mouth every 6 (six) hours as needed for mild pain (pain score 1-3), fever or headache.   alendronate (FOSAMAX) 70 MG tablet Take 70 mg by mouth once a week. Take  with a full glass of water on an empty stomach.   amLODipine  (NORVASC ) 10 MG tablet Take 10 mg by mouth daily.   aspirin EC 81 MG tablet Take 81 mg by mouth daily.   BIOGAIA PROBIOTIC (BIOGAIA PROBIOTIC) LIQD Take 5 drops by mouth daily at 8 pm.   bismuth subsalicylate (PEPTO BISMOL) 262 MG/15ML suspension Take 30 mLs by mouth every 4 (four) hours as needed.   calcium carbonate (TUMS - DOSED IN MG ELEMENTAL CALCIUM) 500 MG chewable tablet Chew 1 tablet by mouth daily.   cholecalciferol (VITAMIN D3) 25 MCG (1000 UNIT) tablet Take 1,000 Units by mouth daily.   citalopram  (CELEXA ) 20 MG tablet Take 20 mg by mouth daily.   dicyclomine  (BENTYL ) 20 MG tablet Take 20 mg by mouth every 6 (six) hours.   enoxaparin  (LOVENOX ) 40 MG/0.4ML injection Inject 0.4 mLs (40 mg total) into the skin daily for 14 days.   lisinopril  (PRINIVIL ,ZESTRIL ) 10 MG tablet Take 10 mg by mouth daily.   loperamide (IMODIUM) 2 MG capsule Take 2 mg by mouth as needed for diarrhea or loose stools.   methocarbamol  (ROBAXIN ) 500 MG tablet Take 250-500 mg by mouth 2 (two)  times daily as needed.   multivitamin-lutein (OCUVITE-LUTEIN) CAPS capsule Take 2 capsules by mouth daily.   Omega-3 Fatty Acids (FISH OIL CONCENTRATE PO) Take 1 capsule by mouth daily.   oxyCODONE  (OXY IR/ROXICODONE ) 5 MG immediate release tablet Take 0.5-1 tablets (2.5-5 mg total) by mouth in the morning and at bedtime.   vitamin E (VITAMIN E) 1000 UNIT capsule Take 1,000 Units by mouth daily.   [DISCONTINUED] oxyCODONE  (OXY IR/ROXICODONE ) 5 MG immediate release tablet Take 0.5-1 tablets (2.5-5 mg total) by mouth every 6 (six) hours as needed for moderate pain (pain score 4-6) or severe pain (pain score 7-10).   [DISCONTINUED] oxyCODONE  (OXY IR/ROXICODONE ) 5 MG immediate release tablet Take 0.5-1 tablets (2.5-5 mg total) by mouth every 6 (six) hours as needed for moderate pain (pain score 4-6) or severe pain (pain score 7-10).   [DISCONTINUED] oxyCODONE  (OXY IR/ROXICODONE ) 5 MG immediate release tablet Take 0.5-1 tablets (2.5-5 mg total) by mouth in the morning and at bedtime.   No facility-administered encounter medications on file as of 09/14/2023.    Review of Systems  There were no vitals filed for this visit. There is no height or weight on file to calculate BMI. Physical Exam Constitutional:      Appearance: Normal appearance.  Cardiovascular:     Rate and Rhythm: Normal rate and regular rhythm.     Pulses: Normal pulses.     Heart sounds: Normal heart sounds.  Pulmonary:     Effort: Pulmonary effort is normal.  Abdominal:     General: Abdomen is flat. Bowel sounds are normal.     Palpations: Abdomen is soft.  Musculoskeletal:        General: Swelling present. No tenderness.     Comments: Bilateral unna Boots Present  Skin:    General: Skin is warm and dry.  Neurological:     Mental Status: She is alert and oriented to person, place, and time.     Gait: Gait normal.  Psychiatric:        Mood and Affect: Mood normal.    Physical Exam   Labs reviewed: Basic Metabolic  Panel: Recent Labs    07/16/23 0529 07/17/23 0553 07/18/23 0425 07/23/23 0000  NA 130* 131* 132* 130*  K 3.9 3.9 3.9 3.6  CL 96* 95*  97* 94*  CO2 28 28 28  26*  GLUCOSE 99 113* 103*  --   BUN 10 15 21 20   CREATININE 0.78 0.68 0.76 0.8  CALCIUM 8.3* 8.2* 8.2* 8.0*   Liver Function Tests: Recent Labs    07/14/23 1655  AST 27  ALT 25  ALKPHOS 61  BILITOT 0.8  PROT 6.3*  ALBUMIN 3.7   No results for input(s): "LIPASE", "AMYLASE" in the last 8760 hours. No results for input(s): "AMMONIA" in the last 8760 hours. CBC: Recent Labs    07/14/23 1655 07/15/23 0757 07/16/23 0529 07/17/23 0553 07/18/23 0425 07/23/23 0000  WBC 20.9* 10.5 10.1 10.3 10.0 18.4  NEUTROABS 18.6* 8.3* 7.6  --   --   --   HGB 10.6* 9.8* 8.9* 8.9* 8.3* 9.8*  HCT 30.7* 28.4* 26.4* 25.3* 24.7* 30*  MCV 84.1 84.8 86.0 84.6 86.1  --   PLT 337 302 291 288 328 690*   Cardiac Enzymes: No results for input(s): "CKTOTAL", "CKMB", "CKMBINDEX", "TROPONINI" in the last 8760 hours. BNP: Invalid input(s): "POCBNP" No results found for: "HGBA1C" No results found for: "TSH" Lab Results  Component Value Date   VITAMINB12 1,176 (H) 07/16/2023   Lab Results  Component Value Date   FOLATE 30.0 07/16/2023   Lab Results  Component Value Date   IRON 31 07/16/2023   TIBC 311 07/16/2023   FERRITIN 27 07/16/2023   Results    Imaging and Procedures obtained prior to SNF admission: DG HIP UNILAT WITH PELVIS 2-3 VIEWS LEFT Result Date: 07/15/2023 CLINICAL DATA:  Elective surgery. EXAM: DG HIP (WITH OR WITHOUT PELVIS) 2-3V LEFT COMPARISON:  Preoperative imaging. FINDINGS: Five fluoroscopic spot views of the left hip submitted from the operating room. Femoral intramedullary nail with trans trochanteric and distal locking screw fixation traverse proximal femur fracture. Fluoroscopy time 1 minutes 42 seconds. Dose 14.4 mGy. IMPRESSION: Intraoperative fluoroscopy during proximal femur fracture fixation. Electronically  Signed   By: Chadwick Colonel M.D.   On: 07/15/2023 14:50   DG C-Arm 1-60 Min-No Report Result Date: 07/15/2023 Fluoroscopy was utilized by the requesting physician.  No radiographic interpretation.   DG Femur Portable Min 2 Views Left Result Date: 07/14/2023 CLINICAL DATA:  Patient with known left intertrochanteric femoral fracture. Full length femur images requested by ordering provider. EXAM: LEFT FEMUR PORTABLE 2 VIEWS COMPARISON:  Left hip radiographs dated 07/14/2023 5:20 p.m. FINDINGS: A displaced and comminuted intertrochanteric fracture of the left proximal femur is again noted. No additional fracture identified. IMPRESSION: Redemonstrated displaced and comminuted intertrochanteric fracture of the left proximal femur. No additional fracture identified. Electronically Signed   By: Mannie Seek M.D.   On: 07/14/2023 21:09   DG Chest 1 View Result Date: 07/14/2023 CLINICAL DATA:  Fall, initial encounter, left hip fracture. EXAM: CHEST  1 VIEW COMPARISON:  None Available. FINDINGS: The cardiomediastinal contours are normal. Aortic atherosclerosis. Pulmonary vasculature is normal. No consolidation, pleural effusion, or pneumothorax. No acute osseous abnormalities are seen. IMPRESSION: No active disease. Electronically Signed   By: Chadwick Colonel M.D.   On: 07/14/2023 18:53   DG Hip Unilat W or Wo Pelvis 2-3 Views Left Result Date: 07/14/2023 CLINICAL DATA:  Status post fall with pain, shortening and rotation. EXAM: DG HIP (WITH OR WITHOUT PELVIS) 2-3V LEFT COMPARISON:  None Available. FINDINGS: Displaced and comminuted intertrochanteric left proximal femur fracture. Apex lateral angulation with mild proximal migration of the femoral shaft. The femoral head is seated, no hip dislocation. 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 Soft tissue edema is seen lateral to the fracture site. IMPRESSION: Displaced and comminuted intertrochanteric left proximal femur fracture. Electronically  Signed   By: Chadwick Colonel M.D.   On: 07/14/2023 18:53   CT Head Wo Contrast Result Date: 07/14/2023 CLINICAL DATA:  Fall with trauma to the head and neck EXAM: CT HEAD WITHOUT CONTRAST CT CERVICAL SPINE WITHOUT CONTRAST TECHNIQUE: Multidetector CT imaging of the head and cervical spine was performed following the standard protocol without intravenous contrast. Multiplanar CT image reconstructions of the cervical spine were also generated. RADIATION DOSE REDUCTION: This exam was performed according to the departmental dose-optimization program which includes automated exposure control, adjustment of the mA and/or kV according to patient size and/or use of iterative reconstruction technique. COMPARISON:  None Available. FINDINGS: CT HEAD FINDINGS Brain: Age related volume loss without subjective lobar predominance. Chronic small-vessel ischemic changes of the cerebral hemispheric white matter without evidence of acute supra tentorial stroke. Benign calcification within the right pons which could be an insignificant idiopathic calcification or could relate to a small cavernoma. Neither should be of clinical relevance. No evidence of acute hemorrhage, mass, hydrocephalus or extra-axial collection. Vascular: There is atherosclerotic calcification of the major vessels at the base of the brain. Skull: Negative Sinuses/Orbits: Clear/normal Other: None CT CERVICAL SPINE FINDINGS Alignment: Mild scoliosis. Skull base and vertebrae: No regional fracture or focal destructive lesion. Minimal depression of the superior endplates of T2 and T3, favored to be old. Soft tissues and spinal canal: No traumatic soft tissue finding. No visible mass or adenopathy. Disc levels: Chronic degenerative spondylosis from C3-4 through C6-7. Mild osteophytic encroachment upon the canal or foramina. No severe or advanced disease. Upper chest: Scarring at the lung apices. Other: None IMPRESSION: HEAD CT: No acute or traumatic finding. Age  related volume loss and chronic small-vessel ischemic changes of the white matter. CERVICAL SPINE CT: No acute or traumatic finding. Chronic degenerative spondylosis from C3-4 through C6-7. Minimal depression of the superior endplates of T2 and T3, favored to be old. Electronically Signed   By: Bettylou Brunner M.D.   On: 07/14/2023 17:34   CT Cervical Spine Wo Contrast Result Date: 07/14/2023 CLINICAL DATA:  Fall with trauma to the head and neck EXAM: CT HEAD WITHOUT CONTRAST CT CERVICAL SPINE WITHOUT CONTRAST TECHNIQUE: Multidetector CT imaging of the head and cervical spine was performed following the standard protocol without intravenous contrast. Multiplanar CT image reconstructions of the cervical spine were also generated. RADIATION DOSE REDUCTION: This exam was performed according to the departmental dose-optimization program which includes automated exposure control, adjustment of the mA and/or kV according to patient size and/or use of iterative reconstruction technique. COMPARISON:  None Available. FINDINGS: CT HEAD FINDINGS Brain: Age related volume loss without subjective lobar predominance. Chronic small-vessel ischemic changes of the cerebral hemispheric white matter without evidence of acute supra tentorial stroke. Benign calcification within the right pons which could be an insignificant idiopathic calcification or could relate to a small cavernoma. Neither should be of clinical relevance. No evidence of acute hemorrhage, mass, hydrocephalus or extra-axial collection. Vascular: There is atherosclerotic calcification of the major vessels at the base of the brain. Skull: Negative Sinuses/Orbits: Clear/normal Other: None CT CERVICAL SPINE FINDINGS Alignment: Mild scoliosis. Skull base and vertebrae: No regional fracture or focal destructive lesion. Minimal depression of the superior endplates of T2 and T3, favored to be old. Soft tissues and spinal canal: No traumatic soft tissue finding. No visible  mass or adenopathy. Disc levels: Chronic  degenerative spondylosis from C3-4 through C6-7. Mild osteophytic encroachment upon the canal or foramina. No severe or advanced disease. Upper chest: Scarring at the lung apices. Other: None IMPRESSION: HEAD CT: No acute or traumatic finding. Age related volume loss and chronic small-vessel ischemic changes of the white matter. CERVICAL SPINE CT: No acute or traumatic finding. Chronic degenerative spondylosis from C3-4 through C6-7. Minimal depression of the superior endplates of T2 and T3, favored to be old. Electronically Signed   By: Bettylou Brunner M.D.   On: 07/14/2023 17:34    Assessment/Plan Severe spinal stenosis with chronic back pain Chronic back pain due to severe spinal stenosis, persistent for four years. Previously managed with oxycodone , current pain management is inadequate, affecting sleep and daily activities. Concerns about increased fall risk with opioid use, but she has a history of safe use without falls. Oxycodone  allows her to maintain a relatively normal life. - Prescribe oxycodone  twice daily for pain management. Monitor for increased fall risk and reassess if stability decreases.  Bilateral leg swelling with leakage Chronic bilateral leg swelling with leakage for 8-9 months. Previous workup showed normal kidney and liver function. BNP borderline at 98, suggesting possible cardiac involvement. No recent echocardiogram. Scheduled to see a cardiologist on May 13. Swelling and leakage have not improved, indicating need for further intervention. Lasix is considered appropriate due to the prolonged nature of the swelling. - Order echocardiogram to assess cardiac function. - Initiate Lasix for diuresis to manage fluid overload. Educate on increased urination and importance of maintaining hydration.  Hx of Hip Fracture leading to debility on 07/14/2023 Now lives in Assisted living receiving assistance with medications and stand by assist for  showering.   Irritable bowel syndrome with diarrhea IBS with diarrhea, managed with Imodium. Current administration of Imodium is inadequate, only given in the morning. She reports better control with Imodium compared to previous medications. Prefers dosing after breakfast and dinner for better symptom control. - Adjust Imodium administration to allow dosing after breakfast and dinner for better symptom control.  Goals of Care She prefers no resuscitation or frequent hospital visits, prioritizing comfort if her condition worsens. Her brother holds the advanced directives, and she wishes to be allowed to pass peacefully without intervention if unresponsive. - Ensure advanced directives are documented and accessible. Confirm with her brother regarding the no resuscitation order.   Family/ staff Communication: nursing, SIL  Labs/tests ordered:CBC, CMP, BNP, Echocardiogram  I spent greater than 45  minutes for the care of this patient in face to face time, chart review, clinical documentation, patient education. I spent an additional 16 minutes discussing goals of care and advanced care planning.

## 2023-09-15 ENCOUNTER — Encounter: Payer: Self-pay | Admitting: Student

## 2023-09-24 LAB — COMPREHENSIVE METABOLIC PANEL WITH GFR: Calcium: 8.7 (ref 8.7–10.7)

## 2023-09-24 LAB — BASIC METABOLIC PANEL WITH GFR
BUN: 19 (ref 4–21)
CO2: 29 — AB (ref 13–22)
Chloride: 90 — AB (ref 99–108)
Creatinine: 0.8 (ref 0.5–1.1)
Glucose: 45
Potassium: 4.8 meq/L (ref 3.5–5.1)
Sodium: 127 — AB (ref 137–147)

## 2023-10-01 ENCOUNTER — Encounter: Payer: Self-pay | Admitting: Cardiovascular Disease

## 2023-10-01 ENCOUNTER — Ambulatory Visit: Attending: Cardiovascular Disease | Admitting: Cardiovascular Disease

## 2023-10-01 VITALS — BP 130/50 | HR 84 | Ht 59.0 in | Wt 104.0 lb

## 2023-10-01 DIAGNOSIS — E7849 Other hyperlipidemia: Secondary | ICD-10-CM | POA: Diagnosis not present

## 2023-10-01 DIAGNOSIS — I10 Essential (primary) hypertension: Secondary | ICD-10-CM | POA: Diagnosis present

## 2023-10-01 DIAGNOSIS — R0602 Shortness of breath: Secondary | ICD-10-CM | POA: Insufficient documentation

## 2023-10-01 DIAGNOSIS — R600 Localized edema: Secondary | ICD-10-CM | POA: Insufficient documentation

## 2023-10-01 LAB — BASIC METABOLIC PANEL WITH GFR
BUN: 21 (ref 4–21)
CO2: 30 — AB (ref 13–22)
Chloride: 92 — AB (ref 99–108)
Creatinine: 0.9 (ref 0.5–1.1)
Glucose: 61
Potassium: 5.1 meq/L (ref 3.5–5.1)
Sodium: 128 — AB (ref 137–147)

## 2023-10-01 LAB — COMPREHENSIVE METABOLIC PANEL WITH GFR: Calcium: 8.7 (ref 8.7–10.7)

## 2023-10-01 MED ORDER — LISINOPRIL 40 MG PO TABS: 40.0000 mg | ORAL_TABLET | Freq: Every day | ORAL | 3 refills | Status: AC

## 2023-10-01 NOTE — Progress Notes (Signed)
 Cardiology Office Note   Date:  10/01/2023   ID:  Barbara Galvan, DOB 13-Jul-1932, MRN [id number]  PCP:  Valrie Gehrig, MD  Cardiologist:   Antionette Kirks, MD   No chief complaint on file.     History of Present Illness: Barbara Galvan is a 89 y.o. female who presents for evaluation of lower extremity edema and shortness of breath.  She is a sister in-law of Emerick Hanlon. She lives in assisted living facility at [facility]  Her husband died years ago.  She has been having hard time maintaining her weight and likes to eat tomato soup almost on a daily basis.  She developed severe lower extremity edema over the last few months which has been treated with pressure wraps and she was started on furosemide 20 mg once daily.  She had recent labs done which showed hyponatremia with a sodium of 125 with low albumin at 3.2.  Creatinine was 0.8 with a BUN of 19.  She reports shortness of breath with no chest pain. She has known history of essential hypertension and has been on amlodipine  for a while.     Past Medical History:  Diagnosis Date   Arthritis    Elevated lipids    Encounter for diagnostic colonoscopy due to change in bowel habits    Hypertension    Scoliosis     Past Surgical History:  Procedure Laterality Date   CATARACT EXTRACTION     COLONOSCOPY WITH PROPOFOL  N/A 11/23/2017   Procedure: COLONOSCOPY WITH PROPOFOL ;  Surgeon: Cassie Click, MD;  Location: [facility] ENDOSCOPY;  Service: Endoscopy;  Laterality: N/A;   INTRAMEDULLARY (IM) NAIL INTERTROCHANTERIC Left 07/15/2023   Procedure: INTRAMEDULLARY (IM) NAIL INTERTROCHANTERIC;  Surgeon: Venus Ginsberg, MD;  Location: [facility] ORS;  Service: Orthopedics;  Laterality: Left;   TONSILLECTOMY       Current Outpatient Medications  Medication Sig Dispense Refill   acetaminophen  (TYLENOL ) 325 MG tablet Take 2 tablets (650 mg total) by mouth every 6 (six) hours as needed for mild pain (pain score 1-3), fever or  headache.     alendronate (FOSAMAX) 70 MG tablet Take 70 mg by mouth once a week. Take with a full glass of water on an empty stomach.     amLODipine  (NORVASC ) 10 MG tablet Take 10 mg by mouth daily.     aspirin EC 81 MG tablet Take 81 mg by mouth daily.     BIOGAIA PROBIOTIC (BIOGAIA PROBIOTIC) LIQD Take 5 drops by mouth daily at 8 pm.     bismuth subsalicylate (PEPTO BISMOL) 262 MG/15ML suspension Take 30 mLs by mouth every 4 (four) hours as needed.     calcium carbonate (TUMS - DOSED IN MG ELEMENTAL CALCIUM) 500 MG chewable tablet Chew 1 tablet by mouth daily.     cholecalciferol (VITAMIN D3) 25 MCG (1000 UNIT) tablet Take 1,000 Units by mouth daily.     citalopram  (CELEXA ) 20 MG tablet Take 20 mg by mouth daily.     dicyclomine  (BENTYL ) 20 MG tablet Take 20 mg by mouth every 6 (six) hours.     furosemide (LASIX) 40 MG tablet Take 40 mg by mouth daily.     lisinopril  (PRINIVIL ,ZESTRIL ) 10 MG tablet Take 10 mg by mouth daily.     loperamide (IMODIUM) 2 MG capsule Take 2 mg by mouth as needed for diarrhea or loose stools.     methocarbamol  (ROBAXIN ) 500 MG tablet Take 250-500 mg by mouth 2 (two) times daily  as needed.     multivitamin-lutein (OCUVITE-LUTEIN) CAPS capsule Take 2 capsules by mouth daily.     Omega-3 Fatty Acids (FISH OIL CONCENTRATE PO) Take 1 capsule by mouth daily.     oxyCODONE  (OXY IR/ROXICODONE ) 5 MG immediate release tablet Take 0.5-1 tablets (2.5-5 mg total) by mouth in the morning and at bedtime. 60 tablet 0   vitamin E (VITAMIN E) 1000 UNIT capsule Take 1,000 Units by mouth daily.     enoxaparin  (LOVENOX ) 40 MG/0.4ML injection Inject 0.4 mLs (40 mg total) into the skin daily for 14 days. 5.6 mL 0   No current facility-administered medications for this visit.    Allergies:   Patient has no known allergies.    Social History:  The patient  reports that she quit smoking about 12 years ago. Her smoking use included cigarettes. She has never used smokeless tobacco. She  reports that she does not drink alcohol and does not use drugs.   Family History:  The patient's family history is not on file.    ROS:  Please see the history of present illness.   Otherwise, review of systems are positive for none.   All other systems are reviewed and negative.    PHYSICAL EXAM: VS:  BP (!) 130/50 (BP Location: Left Arm, Patient Position: Sitting, Cuff Size: Normal)   Pulse 84   Ht 4\' 11"  (1.499 m)   Wt 104 lb (47.2 kg)   SpO2 97%   BMI 21.01 kg/m  , BMI Body mass index is 21.01 kg/m. GEN: Well nourished, well developed, in no acute distress  HEENT: normal  Neck: no JVD, carotid bruits, or masses Cardiac: RRR; no murmurs, rubs, or gallops, moderate to severe bilateral leg edema Respiratory:  clear to auscultation bilaterally, normal work of breathing GI: soft, nontender, nondistended, + BS MS: no deformity or atrophy  Skin: warm and dry, no rash Neuro:  Strength and sensation are intact Psych: euthymic mood, full affect   EKG:  EKG is ordered today. The ekg ordered today demonstrates : Normal sinus rhythm Motion  Artifact    Recent Labs: 07/14/2023: ALT 25 07/23/2023: BUN 20; Creatinine 0.8; Hemoglobin 9.8; Platelets 690; Potassium 3.6; Sodium 130    Lipid Panel No results found for: "CHOL", "TRIG", "HDL", "CHOLHDL", "VLDL", "LDLCALC", "LDLDIRECT"    Wt Readings from Last 3 Encounters:  10/01/23 104 lb (47.2 kg)  09/15/23 102 lb (46.3 kg)  08/19/23 100 lb 6.4 oz (45.5 kg)           No data to display            ASSESSMENT AND PLAN:  1.  Severe lower extremity edema: Suspect this is likely multifactorial due to chronic venous insufficiency, high-dose amlodipine , low albumin causing third spacing and high sodium intake.  The patient consumes excessive amount of sodium especially after she was told that she was hyponatremic.  I think this is counterproductive.  I discussed the importance of decreasing sodium intake and increasing protein  intake. Will obtain an echocardiogram to ensure no component of heart failure. I elected to stop amlodipine  today.  2.  Essential hypertension: Due to stopping amlodipine , I elected to increase lisinopril  to 40 mg once daily.  We can consider adding spironolactone but will have to monitor electrolytes closely.  3.  Hyponatremia: The exact etiology is not entirely clear but given her hypervolemia, I advised her to decrease water intake instead of increasing dietary sodium.     Disposition:  FU after echocardiogram  Signed,  Antionette Kirks, MD  10/01/2023 10:44 AM    [facility]

## 2023-10-01 NOTE — Patient Instructions (Signed)
 Medication Instructions:  Your physician recommends the following medication changes.  STOP TAKING: Amlodipine   INCREASE: Lisinopril  to 40mg  daily   *If you need a refill on your cardiac medications before your next appointment, please call your pharmacy*  Lab Work: No labs ordered today  If you have labs (blood work) drawn today and your tests are completely normal, you will receive your results only by: MyChart Message (if you have MyChart) OR A paper copy in the mail If you have any lab test that is abnormal or we need to change your treatment, we will call you to review the results.  Testing/Procedures: Echo  Your physician has requested that you have an echocardiogram. Echocardiography is a painless test that uses sound waves to create images of your heart. It provides your doctor with information about the size and shape of your heart and how well your heart's chambers and valves are working.   You may receive an ultrasound enhancing agent through an IV if needed to better visualize your heart during the echo. This procedure takes approximately one hour.  There are no restrictions for this procedure.  This will take place at [address] [facility] [address], [place] [address]  Please note: We ask at that you not bring children with you during ultrasound (echo/ vascular) testing. Due to room size and safety concerns, children are not allowed in the ultrasound rooms during exams. Our front office staff cannot provide observation of children in our lobby area while testing is being conducted. An adult accompanying a patient to their appointment will only be allowed in the ultrasound room at the discretion of the ultrasound technician under special circumstances. We apologize for any inconvenience.   Follow-Up: At [facility], you and your health needs are our priority.  As part of our continuing mission to provide you with exceptional heart care, our  providers are all part of one team.  This team includes your primary Cardiologist (physician) and Advanced Practice Providers or APPs (Physician Assistants and Nurse Practitioners) who all work together to provide you with the care you need, when you need it.  Your next appointment:   After Echo complete  Provider:   Antionette Kirks, MD    We recommend signing up for the patient portal called "MyChart".  Sign up information is provided on this After Visit Summary.  MyChart is used to connect with patients for Virtual Visits (Telemedicine).  Patients are able to view lab/test results, encounter notes, upcoming appointments, etc.  Non-urgent messages can be sent to your provider as well.   To learn more about what you can do with MyChart, go to [email or web address].

## 2023-10-06 ENCOUNTER — Ambulatory Visit: Payer: Medicare Other | Admitting: Cardiovascular Disease

## 2023-10-09 ENCOUNTER — Ambulatory Visit: Attending: Cardiovascular Disease

## 2023-10-09 DIAGNOSIS — R0602 Shortness of breath: Secondary | ICD-10-CM

## 2023-10-09 LAB — ECHOCARDIOGRAM COMPLETE
AR max vel: 1.31 cm2
AV Area VTI: 1.5 cm2
AV Area mean vel: 1.21 cm2
AV Mean grad: 6 mmHg
AV Peak grad: 10.9 mmHg
Ao pk vel: 1.65 m/s
Area-P 1/2: 3.65 cm2
S' Lateral: 1.8 cm
Single Plane A4C EF: 81.1 %

## 2023-10-12 ENCOUNTER — Ambulatory Visit: Payer: Self-pay | Admitting: Cardiovascular Disease

## 2023-10-23 ENCOUNTER — Ambulatory Visit: Admitting: Cardiovascular Disease

## 2023-11-09 ENCOUNTER — Other Ambulatory Visit: Payer: Self-pay | Admitting: Student

## 2023-11-09 DIAGNOSIS — M48061 Spinal stenosis, lumbar region without neurogenic claudication: Secondary | ICD-10-CM

## 2023-11-19 ENCOUNTER — Non-Acute Institutional Stay: Payer: Self-pay | Admitting: Nurse Practitioner

## 2023-11-19 ENCOUNTER — Encounter: Payer: Self-pay | Admitting: Nurse Practitioner

## 2023-11-19 DIAGNOSIS — R600 Localized edema: Secondary | ICD-10-CM

## 2023-11-19 DIAGNOSIS — M48061 Spinal stenosis, lumbar region without neurogenic claudication: Secondary | ICD-10-CM

## 2023-11-19 DIAGNOSIS — M810 Age-related osteoporosis without current pathological fracture: Secondary | ICD-10-CM

## 2023-11-19 DIAGNOSIS — I10 Essential (primary) hypertension: Secondary | ICD-10-CM

## 2023-11-19 DIAGNOSIS — E871 Hypo-osmolality and hyponatremia: Secondary | ICD-10-CM | POA: Diagnosis not present

## 2023-11-19 DIAGNOSIS — F330 Major depressive disorder, recurrent, mild: Secondary | ICD-10-CM | POA: Diagnosis not present

## 2023-11-19 DIAGNOSIS — K580 Irritable bowel syndrome with diarrhea: Secondary | ICD-10-CM

## 2023-11-19 NOTE — Progress Notes (Signed)
 Location:  Other [facility].  Nursing Home Room Number: [facility]. [employer] [id number] Place of Service:  ALF [phone]) Harlene An, NP  PCP: Abdul Fine, MD  Patient Care Team: Abdul Fine, MD as PCP - General (Family Medicine)  Extended Emergency Contact Information Primary Emergency Contact: smith,beverly Mobile Phone: [phone] Relation: Sister Secondary Emergency Contact: Smith,chuck Mobile Phone: [phone] Relation: Brother  Goals of care: Advanced Directive information    11/19/2023   12:01 PM  Advanced Directives  Does Patient Have a Medical Advance Directive? Yes  Type of Advance Directive Out of facility DNR (pink MOST or yellow form)  Does patient want to make changes to medical advance directive? No - Patient declined     Chief Complaint  Patient presents with   Medical Management of Chronic Issues    Medical Management of Chronic Issues.     HPI:  Pt is a 89 y.o. female seen today for medical management of chronic disease. She is living at [facility] in assisted living.   She experiences chronic diarrhea with episodes of urgency, leading to difficulty reaching the bathroom in time. She has been using Imodium BID, has one dose she can use as needed and takes Questran daily. She is unsure of the exact number of bowel movements today but indicates it has been quite a few.  She has been on Fosamax (alendronate) for osteoporosis for several years, but is unsure of the exact duration. Her endocrinologist initially prescribed it, but she is no longer seeing her.  For depression, she takes Celexa  (citalopram ) one pill at bedtime, which she reports is effective.  She experiences back pain for which she takes oxycodone , and she reports that it manages the pain well. No side effects noted.   She has hearing difficulties, with the right hearing aid not functioning properly, and plans to have her sister assist with getting it repaired.  She reports  good sleep quality, stating she falls asleep quickly after taking her anxiety medication.  No issues with leg swelling as long as she uses Ace wraps or Una boots.    Past Medical History:  Diagnosis Date   Arthritis    Elevated lipids    Encounter for diagnostic colonoscopy due to change in bowel habits    Hypertension    Scoliosis    Past Surgical History:  Procedure Laterality Date   CATARACT EXTRACTION     COLONOSCOPY WITH PROPOFOL  N/A 11/23/2017   Procedure: COLONOSCOPY WITH PROPOFOL ;  Surgeon: Viktoria Lamar DASEN, MD;  Location: [facility] ENDOSCOPY;  Service: Endoscopy;  Laterality: N/A;   INTRAMEDULLARY (IM) NAIL INTERTROCHANTERIC Left 07/15/2023   Procedure: INTRAMEDULLARY (IM) NAIL INTERTROCHANTERIC;  Surgeon: Lorelle Hussar, MD;  Location: [facility] ORS;  Service: Orthopedics;  Laterality: Left;   TONSILLECTOMY      No Known Allergies  Outpatient Encounter Medications as of 11/19/2023  Medication Sig   acetaminophen  (TYLENOL ) 325 MG tablet Take 2 tablets (650 mg total) by mouth every 6 (six) hours as needed for mild pain (pain score 1-3), fever or headache. (Patient taking differently: Take 650 mg by mouth every 4 (four) hours as needed for mild pain (pain score 1-3), fever or headache.)   alendronate (FOSAMAX) 70 MG tablet Take 70 mg by mouth once a week. Take with a full glass of water on an empty stomach.   aspirin EC 81 MG tablet Take 81 mg by mouth daily.   BIOGAIA PROBIOTIC (BIOGAIA PROBIOTIC) LIQD Take 5 drops by mouth daily at 8 pm.  bismuth subsalicylate (PEPTO BISMOL) 262 MG/15ML suspension Take 30 mLs by mouth every 4 (four) hours as needed.   carbamide peroxide (DEBROX) 6.5 % OTIC solution 5 drops as needed.   cetirizine (ZYRTEC) 5 MG tablet Take 5 mg by mouth as needed for allergies.   cholecalciferol (VITAMIN D3) 25 MCG (1000 UNIT) tablet Take 1,000 Units by mouth daily.   cholestyramine (QUESTRAN) 4 g packet Take 4 g by mouth daily.   citalopram  (CELEXA ) 20 MG tablet  Take 20 mg by mouth daily.   dextromethorphan-guaiFENesin (ROBITUSSIN-DM) 10-100 MG/5ML liquid Take 10 mLs by mouth every 4 (four) hours as needed for cough.   dextrose  (GLUTOSE) 40 % GEL Take 1 Tube by mouth as needed for low blood sugar.   furosemide (LASIX) 40 MG tablet Take 40 mg by mouth daily.   lisinopril  (ZESTRIL ) 40 MG tablet Take 1 tablet (40 mg total) by mouth daily.   loperamide (IMODIUM) 2 MG capsule Take 2 mg by mouth as needed for diarrhea or loose stools.   methocarbamol  (ROBAXIN ) 500 MG tablet Take 250-500 mg by mouth 2 (two) times daily as needed.   multivitamin-lutein (OCUVITE-LUTEIN) CAPS capsule Take 2 capsules by mouth daily.   nystatin (MYCOSTATIN/NYSTOP) powder Apply 1 Application topically as needed.   Omega-3 Fatty Acids (FISH OIL CONCENTRATE PO) Take 1 capsule by mouth daily.   ondansetron  (ZOFRAN ) 4 MG tablet Take 4 mg by mouth every 8 (eight) hours as needed for nausea or vomiting.   oxyCODONE  (OXY IR/ROXICODONE ) 5 MG immediate release tablet Take 1 tablet (5 mg total) by mouth 2 (two) times daily as needed for severe pain (pain score 7-10).   OXYGEN Inhale into the lungs. 2lpm   potassium chloride SA (KLOR-CON M) 20 MEQ tablet Take 20 mEq by mouth daily.   vitamin E (VITAMIN E) 1000 UNIT capsule Take 1,000 Units by mouth daily.   dicyclomine  (BENTYL ) 20 MG tablet Take 20 mg by mouth every 6 (six) hours. (Patient not taking: Reported on 11/19/2023)   [DISCONTINUED] calcium carbonate (TUMS - DOSED IN MG ELEMENTAL CALCIUM) 500 MG chewable tablet Chew 1 tablet by mouth daily. (Patient not taking: Reported on 11/19/2023)   [DISCONTINUED] enoxaparin  (LOVENOX ) 40 MG/0.4ML injection Inject 0.4 mLs (40 mg total) into the skin daily for 14 days. (Patient not taking: Reported on 11/19/2023)   No facility-administered encounter medications on file as of 11/19/2023.    Review of Systems  Constitutional:  Negative for activity change, appetite change, fatigue and unexpected weight  change.  HENT:  Negative for congestion and hearing loss.   Eyes: Negative.   Respiratory:  Negative for cough and shortness of breath.   Cardiovascular:  Negative for chest pain, palpitations and leg swelling.  Gastrointestinal:  Positive for diarrhea. Negative for abdominal pain and constipation.  Genitourinary:  Negative for difficulty urinating and dysuria.  Musculoskeletal:  Negative for arthralgias and myalgias.  Skin:  Negative for color change and wound.  Neurological:  Negative for dizziness and weakness.  Psychiatric/Behavioral:  Negative for agitation, behavioral problems and confusion.      Immunization History  Administered Date(s) Administered   Fluzone Influenza virus vaccine,trivalent (IIV3), split virus 01/25/2011, 02/24/2012   Influenza Inj Mdck Quad Pf 02/18/2016   Influenza-Unspecified 02/13/2014, 03/01/2015, 01/21/2018   PFIZER Comirnaty(Gray Top)Covid-19 Tri-Sucrose Vaccine 09/18/2020   Pneumococcal Conjugate-13 10/31/2014   Pneumococcal Polysaccharide-23 06/11/2009, 02/19/2017   Respiratory Syncytial Virus Vaccine,Recomb Aduvanted(Arexvy) 02/24/2022   Unspecified SARS-COV-2 Vaccination 06/18/2019, 07/09/2019, 02/16/2020   Zoster Recombinant(Shingrix) 11/10/2017,  01/21/2018   Pertinent  Health Maintenance Due  Topic Date Due   DEXA SCAN  Never done   INFLUENZA VACCINE  12/25/2023       No data to display         Functional Status Survey:    Vitals:   11/19/23 1058  BP: 123/67  Pulse: 78  Resp: 16  Temp: 97.8 F (36.6 C)  SpO2: 93%  Weight: 98 lb (44.5 kg)  Height: 4' 11 (1.499 m)   Body mass index is 19.79 kg/m. Physical Exam Constitutional:      General: She is not in acute distress.    Appearance: She is well-developed. She is not diaphoretic.  HENT:     Head: Normocephalic and atraumatic.     Mouth/Throat:     Pharynx: No oropharyngeal exudate.   Eyes:     Conjunctiva/sclera: Conjunctivae normal.     Pupils: Pupils are equal,  round, and reactive to light.    Cardiovascular:     Rate and Rhythm: Normal rate and regular rhythm.     Heart sounds: Normal heart sounds.  Pulmonary:     Effort: Pulmonary effort is normal.     Breath sounds: Normal breath sounds.  Abdominal:     General: Bowel sounds are normal.     Palpations: Abdomen is soft.   Musculoskeletal:     Cervical back: Normal range of motion and neck supple.     Right lower leg: No edema.     Left lower leg: No edema.   Skin:    General: Skin is warm and dry.   Neurological:     Mental Status: She is alert.   Psychiatric:        Mood and Affect: Mood normal.     Labs reviewed: Recent Labs    07/16/23 0529 07/17/23 0553 07/18/23 0425 07/23/23 0000 09/24/23 0000 10/01/23 0000  NA 130* 131* 132* 130* 127* 128*  K 3.9 3.9 3.9 3.6 4.8 5.1  CL 96* 95* 97* 94* 90* 92*  CO2 28 28 28  26* 29* 30*  GLUCOSE 99 113* 103*  --   --   --   BUN 10 15 21 20 19 21   CREATININE 0.78 0.68 0.76 0.8 0.8 0.9  CALCIUM 8.3* 8.2* 8.2* 8.0* 8.7 8.7   Recent Labs    07/14/23 1655  AST 27  ALT 25  ALKPHOS 61  BILITOT 0.8  PROT 6.3*  ALBUMIN 3.7   Recent Labs    07/14/23 1655 07/15/23 0757 07/16/23 0529 07/17/23 0553 07/18/23 0425 07/23/23 0000  WBC 20.9* 10.5 10.1 10.3 10.0 18.4  NEUTROABS 18.6* 8.3* 7.6  --   --   --   HGB 10.6* 9.8* 8.9* 8.9* 8.3* 9.8*  HCT 30.7* 28.4* 26.4* 25.3* 24.7* 30*  MCV 84.1 84.8 86.0 84.6 86.1  --   PLT 337 302 291 288 328 690*   No results found for: TSH No results found for: HGBA1C No results found for: CHOL, HDL, LDLCALC, LDLDIRECT, TRIG, CHOLHDL  Significant Diagnostic Results in last 30 days:  No results found.  Assessment/Plan Bilateral leg edema Managed with compression hose and lasix. If she is not wearing wraps her LE edema gets much worse.   Hypertension Blood pressure well controlled, goal bp <140/90 Continue current medications and dietary modifications follow metabolic  panel  Hyponatremia Appears chronic and stable. Continue to follow  Irritable bowel syndrome with diarrhea Ongoing, continues on questran and immodium BID  Mild episode of  recurrent major depressive disorder (HCC) Controled on celexa  daily  Osteoporosis Has completed fosamax- will stop at this time.  Continue cal and vit d  Spinal stenosis, lumbar region without neurogenic claudication Well controlled on oxycodone , continue BID dosing.      Armida Vickroy K. Caro BODILY [facility] [phone]

## 2023-11-23 DIAGNOSIS — K580 Irritable bowel syndrome with diarrhea: Secondary | ICD-10-CM | POA: Insufficient documentation

## 2023-11-23 DIAGNOSIS — F330 Major depressive disorder, recurrent, mild: Secondary | ICD-10-CM | POA: Insufficient documentation

## 2023-11-23 NOTE — Assessment & Plan Note (Signed)
 Appears chronic and stable. Continue to follow

## 2023-11-23 NOTE — Assessment & Plan Note (Signed)
 Controled on celexa  daily

## 2023-11-23 NOTE — Assessment & Plan Note (Signed)
 Managed with compression hose and lasix. If she is not wearing wraps her LE edema gets much worse.

## 2023-11-23 NOTE — Assessment & Plan Note (Signed)
 Blood pressure well controlled, goal bp <140/90 Continue current medications and dietary modifications follow metabolic panel

## 2023-11-23 NOTE — Assessment & Plan Note (Signed)
 Well controlled on oxycodone , continue BID dosing.

## 2023-11-23 NOTE — Assessment & Plan Note (Signed)
 Ongoing, continues on questran and immodium BID

## 2023-11-23 NOTE — Assessment & Plan Note (Signed)
 Has completed fosamax- will stop at this time.  Continue cal and vit d

## 2023-11-26 ENCOUNTER — Ambulatory Visit: Attending: Cardiovascular Disease | Admitting: Cardiovascular Disease

## 2023-11-26 ENCOUNTER — Encounter: Payer: Self-pay | Admitting: Cardiovascular Disease

## 2023-11-26 VITALS — BP 140/64 | HR 71 | Ht 59.0 in | Wt 99.4 lb

## 2023-11-26 DIAGNOSIS — I872 Venous insufficiency (chronic) (peripheral): Secondary | ICD-10-CM | POA: Diagnosis not present

## 2023-11-26 DIAGNOSIS — I10 Essential (primary) hypertension: Secondary | ICD-10-CM | POA: Diagnosis present

## 2023-11-26 DIAGNOSIS — E871 Hypo-osmolality and hyponatremia: Secondary | ICD-10-CM | POA: Insufficient documentation

## 2023-11-26 NOTE — Patient Instructions (Signed)
 Medication Instructions:  Your physician recommends that you continue on your current medications as directed. Please refer to the Current Medication list given to you today.   *If you need a refill on your cardiac medications before your next appointment, please call your pharmacy*  Lab Work: No labs ordered today  If you have labs (blood work) drawn today and your tests are completely normal, you will receive your results only by: MyChart Message (if you have MyChart) OR A paper copy in the mail If you have any lab test that is abnormal or we need to change your treatment, we will call you to review the results.  Testing/Procedures: No test ordered today   Follow-Up: At [facility], you and your health needs are our priority.  As part of our continuing mission to provide you with exceptional heart care, our providers are all part of one team.  This team includes your primary Cardiologist (physician) and Advanced Practice Providers or APPs (Physician Assistants and Nurse Practitioners) who all work together to provide you with the care you need, when you need it.  Your next appointment:   6 month(s)  Provider:   You may see Dr. Darron or one of the following Advanced Practice Providers on your designated Care Team:   Lonni Meager, NP Lesley Maffucci, PA-C Bernardino Bring, PA-C Cadence Ogden, PA-C Tylene Lunch, NP Barnie Hila, NP    We recommend signing up for the patient portal called MyChart.  Sign up information is provided on this After Visit Summary.  MyChart is used to connect with patients for Virtual Visits (Telemedicine).  Patients are able to view lab/test results, encounter notes, upcoming appointments, etc.  Non-urgent messages can be sent to your provider as well.   To learn more about what you can do with MyChart, go to [email or web address].

## 2023-11-26 NOTE — Progress Notes (Signed)
 Cardiology Office Note   Date:  11/26/2023   ID:  Trini, Soldo 1933/01/18, MRN [id number]  PCP:  Abdul Fine, MD  Cardiologist:   Deatrice Cage, MD   Chief Complaint  Patient presents with   Follow-up    F/u echo no complaints today. Meds reviewed verbally with pt.      History of Present Illness: Barbara Galvan is a 89 y.o. female who is here for a follow-up visit regarding chronic venous insufficiency of the lower extremities.  She is a sister in-law of Barbara Galvan. She lives in assisted living facility at [facility].  Her husband died years ago.   She was seen recently for severe lower extremity edema.  She has known history of hyponatremia.  I discontinued amlodipine  and increase lisinopril .  We also asked her to cut down sodium intake.  An echocardiogram was done in May which showed normal LV systolic function with mild pulmonary hypertension and mildly calcified aortic valve.  Lower extremity edema improved.  She still gets compressive wrapping done on a regular basis.  No chest pain.   Past Medical History:  Diagnosis Date   Arthritis    Elevated lipids    Encounter for diagnostic colonoscopy due to change in bowel habits    Hypertension    Scoliosis     Past Surgical History:  Procedure Laterality Date   CATARACT EXTRACTION     COLONOSCOPY WITH PROPOFOL  N/A 11/23/2017   Procedure: COLONOSCOPY WITH PROPOFOL ;  Surgeon: Viktoria Lamar DASEN, MD;  Location: [facility] ENDOSCOPY;  Service: Endoscopy;  Laterality: N/A;   INTRAMEDULLARY (IM) NAIL INTERTROCHANTERIC Left 07/15/2023   Procedure: INTRAMEDULLARY (IM) NAIL INTERTROCHANTERIC;  Surgeon: Lorelle Hussar, MD;  Location: [facility] ORS;  Service: Orthopedics;  Laterality: Left;   TONSILLECTOMY       Current Outpatient Medications  Medication Sig Dispense Refill   acetaminophen  (TYLENOL ) 325 MG tablet Take 2 tablets (650 mg total) by mouth every 6 (six) hours as needed for mild pain (pain score 1-3),  fever or headache. (Patient taking differently: Take 650 mg by mouth every 4 (four) hours as needed for mild pain (pain score 1-3), fever or headache.)     alendronate (FOSAMAX) 70 MG tablet Take 70 mg by mouth once a week. Take with a full glass of water on an empty stomach.     aspirin EC 81 MG tablet Take 81 mg by mouth daily.     BIOGAIA PROBIOTIC (BIOGAIA PROBIOTIC) LIQD Take 5 drops by mouth daily at 8 pm.     bismuth subsalicylate (PEPTO BISMOL) 262 MG/15ML suspension Take 30 mLs by mouth every 4 (four) hours as needed.     carbamide peroxide (DEBROX) 6.5 % OTIC solution 5 drops as needed.     cetirizine (ZYRTEC) 5 MG tablet Take 5 mg by mouth as needed for allergies.     cholecalciferol (VITAMIN D3) 25 MCG (1000 UNIT) tablet Take 1,000 Units by mouth daily.     cholestyramine (QUESTRAN) 4 g packet Take 4 g by mouth daily.     citalopram  (CELEXA ) 20 MG tablet Take 20 mg by mouth daily.     dextrose  (GLUTOSE) 40 % GEL Take 1 Tube by mouth as needed for low blood sugar.     furosemide (LASIX) 40 MG tablet Take 40 mg by mouth daily.     lisinopril  (ZESTRIL ) 40 MG tablet Take 1 tablet (40 mg total) by mouth daily. 90 tablet 3   loperamide (IMODIUM) 2  MG capsule Take 2 mg by mouth as needed for diarrhea or loose stools.     multivitamin-lutein (OCUVITE-LUTEIN) CAPS capsule Take 2 capsules by mouth daily.     Omega-3 Fatty Acids (FISH OIL CONCENTRATE PO) Take 1 capsule by mouth daily.     ondansetron  (ZOFRAN ) 4 MG tablet Take 4 mg by mouth every 8 (eight) hours as needed for nausea or vomiting.     oxyCODONE  (OXY IR/ROXICODONE ) 5 MG immediate release tablet Take 1 tablet (5 mg total) by mouth 2 (two) times daily as needed for severe pain (pain score 7-10). 60 tablet 0   potassium chloride SA (KLOR-CON M) 20 MEQ tablet Take 20 mEq by mouth daily.     vitamin E (VITAMIN E) 1000 UNIT capsule Take 1,000 Units by mouth daily.     dextromethorphan-guaiFENesin (ROBITUSSIN-DM) 10-100 MG/5ML liquid Take  10 mLs by mouth every 4 (four) hours as needed for cough. (Patient not taking: Reported on 11/26/2023)     dicyclomine  (BENTYL ) 20 MG tablet Take 20 mg by mouth every 6 (six) hours. (Patient not taking: Reported on 11/26/2023)     methocarbamol  (ROBAXIN ) 500 MG tablet Take 250-500 mg by mouth 2 (two) times daily as needed. (Patient not taking: Reported on 11/26/2023)     nystatin (MYCOSTATIN/NYSTOP) powder Apply 1 Application topically as needed. (Patient not taking: Reported on 11/26/2023)     OXYGEN Inhale into the lungs. 2lpm (Patient not taking: Reported on 11/26/2023)     No current facility-administered medications for this visit.    Allergies:   Patient has no known allergies.    Social History:  The patient  reports that she quit smoking about 12 years ago. Her smoking use included cigarettes. She has never used smokeless tobacco. She reports that she does not drink alcohol and does not use drugs.   Family History:  The patient's family history is not on file.    ROS:  Please see the history of present illness.   Otherwise, review of systems are positive for none.   All other systems are reviewed and negative.    PHYSICAL EXAM: VS:  BP (!) 140/64 (BP Location: Left Arm, Patient Position: Sitting, Cuff Size: Normal)   Pulse 71   Ht 4' 11 (1.499 m)   Wt 99 lb 6 oz (45.1 kg)   SpO2 95%   BMI 20.07 kg/m  , BMI Body mass index is 20.07 kg/m. GEN: Well nourished, well developed, in no acute distress  HEENT: normal  Neck: no JVD, carotid bruits, or masses Cardiac: RRR; no murmurs, rubs, or gallops, both legs are wrapped. Respiratory:  clear to auscultation bilaterally, normal work of breathing GI: soft, nontender, nondistended, + BS MS: no deformity or atrophy  Skin: warm and dry, no rash Neuro:  Strength and sensation are intact Psych: euthymic mood, full affect   EKG:  EKG is not ordered today.   Recent Labs: 07/14/2023: ALT 25 07/23/2023: Hemoglobin 9.8; Platelets  690 10/01/2023: BUN 21; Creatinine 0.9; Potassium 5.1; Sodium 128    Lipid Panel No results found for: CHOL, TRIG, HDL, CHOLHDL, VLDL, LDLCALC, LDLDIRECT    Wt Readings from Last 3 Encounters:  11/26/23 99 lb 6 oz (45.1 kg)  11/19/23 98 lb (44.5 kg)  10/01/23 104 lb (47.2 kg)           No data to display            ASSESSMENT AND PLAN:  1.  Lower extremity chronic venous insufficiency:  Improved after stopping amlodipine  and decreasing sodium intake.  I do not think we can increase the dose of furosemide given recent labs showing elevated BUN.   Echocardiogram was overall unremarkable.  Continue leg elevation and compressive wraps.  If symptoms persist, will consider a lymphedema pump.  2.  Essential hypertension: Blood pressure is reasonably controlled on lisinopril .  3.  Hyponatremia: This has been stable.  Avoid excessive salt intake.     Disposition:   FU in 6 months.  Signed,  Deatrice Cage, MD  11/26/2023 11:30 AM    [facility]

## 2023-12-08 ENCOUNTER — Other Ambulatory Visit: Payer: Self-pay | Admitting: Student

## 2023-12-08 DIAGNOSIS — M48061 Spinal stenosis, lumbar region without neurogenic claudication: Secondary | ICD-10-CM

## 2024-01-08 ENCOUNTER — Other Ambulatory Visit: Payer: Self-pay | Admitting: Student

## 2024-01-08 DIAGNOSIS — M48061 Spinal stenosis, lumbar region without neurogenic claudication: Secondary | ICD-10-CM

## 2024-01-11 ENCOUNTER — Non-Acute Institutional Stay: Payer: Self-pay | Admitting: Student

## 2024-01-11 ENCOUNTER — Encounter: Payer: Self-pay | Admitting: Student

## 2024-01-11 DIAGNOSIS — R600 Localized edema: Secondary | ICD-10-CM

## 2024-01-11 DIAGNOSIS — K529 Noninfective gastroenteritis and colitis, unspecified: Secondary | ICD-10-CM | POA: Diagnosis not present

## 2024-01-11 DIAGNOSIS — E440 Moderate protein-calorie malnutrition: Secondary | ICD-10-CM

## 2024-01-11 NOTE — Patient Instructions (Signed)
 VISIT SUMMARY:  You came in today for a follow-up after your cardiology appointment. We discussed your recent weight loss, leg swelling, and intermittent diarrhea. You mentioned that you have been eating regularly but still losing weight, and you are active with daily walks. We also talked about your current use of compression stockings and the discomfort they are causing.  YOUR PLAN:  -CHRONIC LOWER EXTREMITY EDEMA: Chronic lower extremity edema means you have ongoing swelling in your legs. This can be managed with compression stockings to prevent fluid buildup. We will switch to zipper compression stockings, which should be easier to use and more comfortable. Please ensure you have help putting them on at 4 AM and taking them off at 8 PM for baths.  -CHRONIC DIARRHEA: Chronic diarrhea means you have ongoing loose stools. You are currently managing this with Imodium. Continue taking Imodium in the morning and at night, and you can take additional doses if needed, but do not exceed four doses per day.  -UNINTENTIONAL WEIGHT LOSS: Unintentional weight loss means you are losing weight without trying. This could be due to not getting enough protein in your diet. We will start you on a Medpass protein supplement once daily with lunch to help maintain your weight and muscle mass. We will also monitor your weight closely.  INSTRUCTIONS:  Please follow up with us  if you experience any new symptoms or if your current symptoms worsen. Continue with your daily activities and dietary habits, and ensure you have assistance with your compression stockings as discussed. We will monitor your weight and adjust your care plan as needed.

## 2024-01-11 NOTE — Progress Notes (Signed)
 Location:  Other Nursing Home Room Number: [facility] [place] [place] [address] Place of Service:  ALF [phone]) Provider:  Abdul Abdul, Richerd, MD  Patient Care Team: Abdul Richerd, MD as PCP - General (Family Medicine)  Extended Emergency Contact Information Primary Emergency Contact: smith,beverly Mobile Phone: [phone] Relation: Sister Secondary Emergency Contact: Smith,chuck Mobile Phone: [phone] Relation: Brother  Code Status:  DNR Goals of care: Advanced Directive information    11/19/2023   12:01 PM  Advanced Directives  Does Patient Have a Medical Advance Directive? Yes  Type of Advance Directive Out of facility DNR (pink MOST or yellow form)  Does patient want to make changes to medical advance directive? No - Patient declined     Chief Complaint  Patient presents with   Medical Management of Chronic Issues    HPI:  Pt is a 89 y.o. female seen today for a routine visit.  Discussed the use of AI scribe software for clinical note transcription with the patient, who gave verbal consent to proceed.  History of Present Illness  History of Present Illness The patient is a 89 year old who presents for follow-up after a cardiology appointment.  She has been experiencing weight loss, which is a concern for her caregiver. Despite eating meals, she often consumes only small portions, such as half a cheeseburger for lunch. She weighs 92 pounds, which she notes is more than she has weighed in years. Her appetite is adequate, and she eats breakfast, lunch, and dinner regularly. Breakfast typically includes a hard-boiled egg, a piece of bacon, jelly, and butter. She maintains an active lifestyle, walking every morning at 4 AM inside her building.  She recently had a cardiology appointment; she recalls that her blood pressure was checked and that the echocardiogram did not show any major concerns, according to her recollection of the visit with Dr. Liberty. For her leg  swelling, she previously used [employer], which helped, and is now using compression stockings. However, she finds the current stockings too tight, causing bruising and discomfort due to tender skin from prolonged wrapping. She is considering trying zipper compression stockings for ease of use.  She experiences diarrhea intermittently and uses Imodium as needed, typically in the morning and at night. Her diarrhea does not correlate with specific foods and has been a long-standing issue.  Her caregiver notes that she has been more active recently, attending church, Bible study, and going out to lunch, which is a positive change.   Social History Living Situation: Resides in a facility where meals are provided. The patient is active in social activities such as going to church and Bible study. She has a routine of walking early in the morning and has a support system in place.   Past Medical History:  Diagnosis Date   Arthritis    Elevated lipids    Encounter for diagnostic colonoscopy due to change in bowel habits    Hypertension    Scoliosis    Past Surgical History:  Procedure Laterality Date   CATARACT EXTRACTION     COLONOSCOPY WITH PROPOFOL  N/A 11/23/2017   Procedure: COLONOSCOPY WITH PROPOFOL ;  Surgeon: Viktoria Lamar DASEN, MD;  Location: [facility] ENDOSCOPY;  Service: Endoscopy;  Laterality: N/A;   INTRAMEDULLARY (IM) NAIL INTERTROCHANTERIC Left 07/15/2023   Procedure: INTRAMEDULLARY (IM) NAIL INTERTROCHANTERIC;  Surgeon: Lorelle Hussar, MD;  Location: [facility] ORS;  Service: Orthopedics;  Laterality: Left;   TONSILLECTOMY      No Known Allergies  Outpatient Encounter Medications as of 01/11/2024  Medication Sig   acetaminophen  (TYLENOL ) 325 MG tablet Take 2 tablets (650 mg total) by mouth every 6 (six) hours as needed for mild pain (pain score 1-3), fever or headache. (Patient taking differently: Take 650 mg by mouth every 4 (four) hours as needed for mild pain (pain score 1-3), fever or  headache.)   alendronate (FOSAMAX) 70 MG tablet Take 70 mg by mouth once a week. Take with a full glass of water on an empty stomach.   aspirin EC 81 MG tablet Take 81 mg by mouth daily.   BIOGAIA PROBIOTIC (BIOGAIA PROBIOTIC) LIQD Take 5 drops by mouth daily at 8 pm.   bismuth subsalicylate (PEPTO BISMOL) 262 MG/15ML suspension Take 30 mLs by mouth every 4 (four) hours as needed.   carbamide peroxide (DEBROX) 6.5 % OTIC solution 5 drops as needed.   cetirizine (ZYRTEC) 5 MG tablet Take 5 mg by mouth as needed for allergies.   cholecalciferol (VITAMIN D3) 25 MCG (1000 UNIT) tablet Take 1,000 Units by mouth daily.   cholestyramine (QUESTRAN) 4 g packet Take 4 g by mouth daily.   citalopram  (CELEXA ) 20 MG tablet Take 20 mg by mouth daily.   dextromethorphan-guaiFENesin (ROBITUSSIN-DM) 10-100 MG/5ML liquid Take 10 mLs by mouth every 4 (four) hours as needed for cough. (Patient not taking: Reported on 11/26/2023)   dextrose  (GLUTOSE) 40 % GEL Take 1 Tube by mouth as needed for low blood sugar.   dicyclomine  (BENTYL ) 20 MG tablet Take 20 mg by mouth every 6 (six) hours. (Patient not taking: Reported on 11/26/2023)   furosemide (LASIX) 40 MG tablet Take 40 mg by mouth daily.   lisinopril  (ZESTRIL ) 40 MG tablet Take 1 tablet (40 mg total) by mouth daily.   loperamide (IMODIUM) 2 MG capsule Take 2 mg by mouth as needed for diarrhea or loose stools.   methocarbamol  (ROBAXIN ) 500 MG tablet Take 250-500 mg by mouth 2 (two) times daily as needed. (Patient not taking: Reported on 11/26/2023)   multivitamin-lutein (OCUVITE-LUTEIN) CAPS capsule Take 2 capsules by mouth daily.   nystatin (MYCOSTATIN/NYSTOP) powder Apply 1 Application topically as needed. (Patient not taking: Reported on 11/26/2023)   Omega-3 Fatty Acids (FISH OIL CONCENTRATE PO) Take 1 capsule by mouth daily.   ondansetron  (ZOFRAN ) 4 MG tablet Take 4 mg by mouth every 8 (eight) hours as needed for nausea or vomiting.   oxyCODONE  (OXY IR/ROXICODONE ) 5  MG immediate release tablet Take 1 tablet (5 mg total) by mouth 2 (two) times daily as needed for severe pain (pain score 7-10).   OXYGEN Inhale into the lungs. 2lpm (Patient not taking: Reported on 11/26/2023)   potassium chloride SA (KLOR-CON M) 20 MEQ tablet Take 20 mEq by mouth daily.   vitamin E (VITAMIN E) 1000 UNIT capsule Take 1,000 Units by mouth daily.   No facility-administered encounter medications on file as of 01/11/2024.    Review of Systems  Immunization History  Administered Date(s) Administered   Fluzone Influenza virus vaccine,trivalent (IIV3), split virus 01/25/2011, 02/24/2012   Influenza Inj Mdck Quad Pf 02/18/2016   Influenza-Unspecified 02/13/2014, 03/01/2015, 01/21/2018   PFIZER Comirnaty(Gray Top)Covid-19 Tri-Sucrose Vaccine 09/18/2020   Pneumococcal Conjugate-13 10/31/2014   Pneumococcal Polysaccharide-23 06/11/2009, 02/19/2017   Respiratory Syncytial Virus Vaccine,Recomb Aduvanted(Arexvy) 02/24/2022   Unspecified SARS-COV-2 Vaccination 06/18/2019, 07/09/2019, 02/16/2020   Zoster Recombinant(Shingrix) 11/10/2017, 01/21/2018   Pertinent  Health Maintenance Due  Topic Date Due   DEXA SCAN  Never done   INFLUENZA VACCINE  12/25/2023  No data to display         Functional Status Survey:    There were no vitals filed for this visit. There is no height or weight on file to calculate BMI. Physical Exam  Physical Exam MEASUREMENTS: Weight- 92. CHEST: Lungs clear to auscultation bilaterally. EXTREMITIES: No edema in lower extremities.  Labs reviewed: Recent Labs    07/16/23 0529 07/17/23 0553 07/18/23 0425 07/23/23 0000 09/24/23 0000 10/01/23 0000  NA 130* 131* 132* 130* 127* 128*  K 3.9 3.9 3.9 3.6 4.8 5.1  CL 96* 95* 97* 94* 90* 92*  CO2 28 28 28  26* 29* 30*  GLUCOSE 99 113* 103*  --   --   --   BUN 10 15 21 20 19 21   CREATININE 0.78 0.68 0.76 0.8 0.8 0.9  CALCIUM 8.3* 8.2* 8.2* 8.0* 8.7 8.7   Recent Labs    07/14/23 1655  AST 27   ALT 25  ALKPHOS 61  BILITOT 0.8  PROT 6.3*  ALBUMIN 3.7   Recent Labs    07/14/23 1655 07/15/23 0757 07/16/23 0529 07/17/23 0553 07/18/23 0425 07/23/23 0000  WBC 20.9* 10.5 10.1 10.3 10.0 18.4  NEUTROABS 18.6* 8.3* 7.6  --   --   --   HGB 10.6* 9.8* 8.9* 8.9* 8.3* 9.8*  HCT 30.7* 28.4* 26.4* 25.3* 24.7* 30*  MCV 84.1 84.8 86.0 84.6 86.1  --   PLT 337 302 291 288 328 690*   No results found for: TSH No results found for: HGBA1C No results found for: CHOL, HDL, LDLCALC, LDLDIRECT, TRIG, CHOLHDL  Significant Diagnostic Results in last 30 days:  No results found.  Assessment/Plan Chronic lower extremity edema Managed with Una boots previously, now transitioning to compression stockings. Current stockings are too tight, causing bruising and discomfort due to tender skin. Compression stockings are essential to prevent fluid accumulation and maintain leg health. - Family to order zipper compression stockings, low end of medium size, regular length, 15-20 mmHg - Ensure assistance with putting on stockings at 4 AM and removing at 8 PM for baths  Chronic diarrhea Variable frequency, managed with Imodium as needed. Recent episode of severe diarrhea. Imodium is taken in the morning and at night, with the option for additional doses if needed, not exceeding four doses per day. - Continue Imodium in the morning and at night - Allow additional doses of Imodium as needed, not exceeding four doses per day  Unintentional weight loss Concern for inadequate protein intake. Current weight is 92 pounds, less than her weight in the past 20 years. Active, walking daily, but dietary intake may not be sufficient to maintain muscle mass and prevent further weight loss. Protein supplementation is considered to ensure adequate intake. - Initiate Medpass protein supplement once daily with lunch - Monitor weight closely   Family/ staff Communication: nursing  Labs/tests ordered:   none

## 2024-01-12 ENCOUNTER — Ambulatory Visit: Admitting: Cardiovascular Disease

## 2024-02-05 ENCOUNTER — Other Ambulatory Visit: Payer: Self-pay | Admitting: Student

## 2024-02-05 DIAGNOSIS — M48061 Spinal stenosis, lumbar region without neurogenic claudication: Secondary | ICD-10-CM

## 2024-02-05 NOTE — Telephone Encounter (Signed)
Controlled Substance:

## 2024-02-16 ENCOUNTER — Other Ambulatory Visit: Payer: Self-pay | Admitting: Family Medicine

## 2024-02-16 DIAGNOSIS — M5416 Radiculopathy, lumbar region: Secondary | ICD-10-CM

## 2024-03-03 ENCOUNTER — Ambulatory Visit
Admission: RE | Admit: 2024-03-03 | Discharge: 2024-03-03 | Disposition: A | Source: Ambulatory Visit | Attending: Family Medicine | Admitting: Family Medicine

## 2024-03-03 DIAGNOSIS — M5416 Radiculopathy, lumbar region: Secondary | ICD-10-CM

## 2024-03-07 ENCOUNTER — Other Ambulatory Visit: Payer: Self-pay | Admitting: Adult Health

## 2024-03-07 DIAGNOSIS — M48061 Spinal stenosis, lumbar region without neurogenic claudication: Secondary | ICD-10-CM

## 2024-03-07 MED ORDER — OXYCODONE HCL 5 MG PO TABS
5.0000 mg | ORAL_TABLET | Freq: Two times a day (BID) | ORAL | 0 refills | Status: DC
Start: 2024-03-07 — End: 2024-04-05

## 2024-03-25 ENCOUNTER — Telehealth (HOSPITAL_BASED_OUTPATIENT_CLINIC_OR_DEPARTMENT_OTHER): Payer: Self-pay | Admitting: *Deleted

## 2024-03-25 NOTE — Telephone Encounter (Signed)
 I was speaking with pt's sister Rojelio Sharps about a preop appt and Dr. Darron for herself. Rojelio tells me that she needs to make an appt with Dr. Darron for her sister as well for 6 month f/u.   Pt has been scheduled to see Dr. Darron 06/20/24 per her Rojelio Sharps pt's sister.

## 2024-04-05 ENCOUNTER — Other Ambulatory Visit: Payer: Self-pay | Admitting: Nurse Practitioner

## 2024-04-05 DIAGNOSIS — M48061 Spinal stenosis, lumbar region without neurogenic claudication: Secondary | ICD-10-CM

## 2024-04-05 MED ORDER — OXYCODONE HCL 5 MG PO TABS: 5.0000 mg | ORAL_TABLET | Freq: Two times a day (BID) | ORAL | 0 refills | Status: DC

## 2024-04-08 NOTE — Progress Notes (Signed)
 HPI The patient is a pleasant 89 year old female who presents today for follow up of acute on chronic low back pain with radiation into the right much greater than left buttock, lateral thighs, and lateral calves.  Her symptoms are worsened with standing and walking.  She has improvement with sitting.  She has taken oxycodone  at [facility] twice daily without side effects.  Unfortunately she has persistent pain and significantly limited activity.  She has previously been followed for acute bilateral mid to low back pain left much greater than right that began in early April 2021 without injury. She reports that she had a flare of pain that began in the fall 2019-05-14 that improved after approximately 1 week of using BenGay and continuing with exercises as learned in physical therapy along with massage. She describes her pain as severe that is sharp and constant progressively increasing since onset. Her pain is worsened with standing and walking. She has been unable to sleep in her bed for the last 2 weeks. She has been sleeping in the living room floor with a heating pad with mild benefit. She feels that sitting at times can provide mild benefit. Her pain can be so severe at times when standing in the shower just having the water run over her back can cause pain.  Medications have included meloxicam (no relief), Skelaxin (no relief, drowsiness), Salonpas patches (no relief), BenGay (mild relief).  Noted in patient chart that she has central stenosis at L3-4, L4-5 and L5-S1. Patient states that MRI was performed in 05/13/2012 in [place] at which time she was told she needed to have lumbar spine surgery however decided against this. She participated in physical therapy at [facility] clinic in [place] in May 13, 2012 and continues to perform these exercises daily.  Her husband passed away in 13-May-2022.   She was last evaluated by Dr. Avanell on 03/16/2024 at which time she was experiencing severe 10 out of 10 pain and after  discussion the decides to move forward with epidural steroid injection on the right with also discussion of facet joint aspiration.  She was to continue with oxycodone  5 mg twice daily as needed prescribed by [facility].  At today's visit she describes no relief following epidural steroid injection.  She continues to experience bilateral low back pain with radiating pain to bilateral lower extremities.  She has full strength on exam today.  She and her family would like to move forward with facet joint aspiration as discussed in last visit with Dr. Avanell.  She continues with oxycodone  with some benefit.  She has begun to develop a slight skin breakdown in the upper thoracic spine.  She is taking oxycodone  5 IR BID PRN at [facility].  We will no longer provide narcotics as this is being managed at [facility].  Procedures: 03/25/2024: Right L4-5 and right L5-S1 transforaminal ESI (no relief) 01/29/2023: bilateral L5 trigger point injections (mild relief) 08/07/2021: left L4-5 and L5-S1 transforaminal ESI (90% relief) 07/03/2020: Left L4-5 and left L5-S1 transforaminal ESI (complete relief) 05/11/2020: Left L4-5 and left L5-S1 transforaminal ESI (moderate to good relief) 01/03/2020: Left L4-5 and left L5-S1 transforaminal ESI (complete relief)  12/16/2019: Left L4-5 and left L5-S1 transforaminal ESI (good relief x3 weeks) 11/24/2019: MBB to the left L3-4 and L4-5 facet joints (10/10 to 10/10) 11/21/2019: Left L4 trigger point injection (no relief)  Past Medical History:  Diagnosis Date  . Cataract cortical, senile   . Change in bowel habits 10/08/2017  . History of  chicken pox   . Hyperlipidemia   . Hypertension   . Osteoporosis, post-menopausal     Past Surgical History:  Procedure Laterality Date  . COLONOSCOPY  11/23/2017   Hyperplastic Polyp: CBF 11/2027  . Closed displaced intertrochanteric fracture of left femur with IM Nailing Left 07/15/2023   By Dr. Lorelle  . CATARACT  EXTRACTION    . TONSILLECTOMY      Social History   Socioeconomic History  . Marital status: Widowed  Tobacco Use  . Smoking status: Former    Current packs/day: 0.00    Average packs/day: 0.3 packs/day for 15.0 years (3.8 ttl pk-yrs)    Types: Cigarettes    Start date: 07/16/1996    Quit date: 07/17/2011    Years since quitting: 12.7  . Smokeless tobacco: Never  Vaping Use  . Vaping status: Never Used  Substance and Sexual Activity  . Alcohol use: No  . Drug use: No  . Sexual activity: Defer   Social Drivers of Health   Financial Resource Strain: Low Risk  (02/12/2024)   Overall Financial Resource Strain (CARDIA)   . Difficulty of Paying Living Expenses: Not hard at all  Food Insecurity: No Food Insecurity (02/12/2024)   Hunger Vital Sign   . Worried About Programme Researcher, Broadcasting/film/video in the Last Year: Never true   . Ran Out of Food in the Last Year: Never true  Transportation Needs: No Transportation Needs (02/12/2024)   PRAPARE - Transportation   . Lack of Transportation (Medical): No   . Lack of Transportation (Non-Medical): No    Family History  Problem Relation Name Age of Onset  . Rheum arthritis Mother    . High blood pressure (Hypertension) Mother      Current Outpatient Medications on File Prior to Visit  Medication Sig Dispense Refill  . amLODIPine  (NORVASC ) 10 MG tablet Take 1 tablet (10 mg total) by mouth once daily 90 tablet 3  . aspirin 81 MG EC tablet Take 81 mg by mouth once daily.    SABRA CALCIUM CARBONATE (CALCIUM 500 ORAL) Take 1,200 mg by mouth once daily.      . cholecalciferol 1000 unit tablet Take 1 tablet by mouth once daily    . citalopram  (CELEXA ) 20 MG tablet Take 1 tablet (20 mg total) by mouth once daily 90 tablet 3  . dicyclomine  (BENTYL ) 20 mg tablet Take 1 tablet (20 mg total) by mouth every 6 (six) hours as needed 30 tablet 1  . DOCOSAHEXANOIC ACID/EPA (FISH OIL ORAL) Take 1,000 mg by mouth once daily.      . Lactobac no.41/Bifidobact no.7  (PROBIOTIC-10 ORAL) Take 1 tablet by mouth once daily    . lisinopriL  (ZESTRIL ) 10 MG tablet Take 1 tablet (10 mg total) by mouth once daily 90 tablet 3  . multivitamin tablet Take 1 tablet by mouth daily.    SABRA VIT A/VIT C/VIT E/ZINC/COPPER (PRESERVISION AREDS ORAL) Take 2 tablets by mouth once daily.    . vitamin E 400 UNIT capsule Take 400 Units by mouth once daily     No current facility-administered medications on file prior to visit.    Allergies as of 04/08/2024  . (No Known Allergies)    ROS More than 10 system, review of system form was given to the patient to fill out and has been signed by [employer] FNP-C and scanned into the patient's chart.   Vital signs Vitals:   04/08/24 1113  BP: 115/62  Pulse: 67  Temp: 36.4 C (97.6 F)  TempSrc: Oral  Weight: (!) 43.5 kg (95 lb 14.4 oz)  Height: 152.4 cm (5')  PainSc: 10-Worst pain ever  PainLoc: Back     Exam General: Alert oriented no distress using rolling walker for assistance with ambulation  Lumbosacral Exam (performed 02/15/2024) Upon inspection there are no rashes or scars.  She is without notable tenderness to the lumbosacral paraspinal musculature.  Extension rotation is very limited due to fall risk.  Lower Extremity Exam 02/15/2024 She has 5/5 strength bilateral dorsiflexors, knee extensors, 4/5 strength in bilateral hip flexors sensation intact to light touch bilaterally decreased to the bilateral calves.. She has +1 bilateral patellar reflexes and trace bilateral Achilles reflexes. No ankle clonus. Straight leg raise is negative bilaterally. Full range of motion to bilateral hip joints without pain elicited.   Radiographic Data MRI from [facility] dated 03/03/2024 both images and report reviewed today. COMPARISON:  Lumbar MRI 11/15/2019  FINDINGS:  Segmentation: Conventional anatomy assumed, with the last open disc  space designated L5-S1.Concordant with prior MRI.  Alignment: Moderate convex right  scoliosis centered at L3-4. Stable  minimal retrolisthesis at L1-2 and L2-3.  Vertebrae: No worrisome osseous lesion, acute fracture or pars  defect. Chronic endplate degenerative changes, similar to previous  study. The visualized sacroiliac joints appear unremarkable.  Conus medullaris: Extends to the L2 level. The conus and cauda  equina appear normal.  Paraspinal and other soft tissues: No significant paraspinal  findings. Numerous renal cysts are again partially imaged  bilaterally; no specific follow-up imaging recommended.   Disc levels:  Sagittal images demonstrate chronic loss of disc height with mild  disc bulging at T11-12. No resulting spinal stenosis or nerve root  encroachment.   T12-L1: Normal interspace.   L1-2: Chronic loss of disc height with annular disc bulging and  endplate osteophytes asymmetric to the right. Mild facet and  ligamentous hypertrophy. No significant spinal stenosis. Stable mild  right foraminal narrowing.   L2-3: Chronic loss of disc height with annular disc bulging and  endplate osteophytes asymmetric to the right. Moderate facet and  ligamentous hypertrophy. Mildly increased narrowing of the left  lateral recess with similar mild left greater than right foraminal  narrowing. The spinal canal is patent.   L3-4: Chronic loss of disc height with annular disc bulging and  endplate osteophytes asymmetric to the left. Moderate facet and  ligamentous hypertrophy. Stable mild spinal stenosis with mildly  increased asymmetric narrowing of the left lateral recess. Unchanged  moderate asymmetric left foraminal narrowing.   L4-5: Chronic loss of disc height with annular disc bulging and  endplate osteophytes. Moderate bilateral facet hypertrophy. No  significant spinal stenosis. Unchanged mild foraminal narrowing  bilaterally.   L5-S1: Preserved disc height with mild disc bulging eccentric to the  right. Bilateral facet degenerative changes, greater  on the right.  Probable small synovial cyst projecting anteriorly from the right  facet joint into the right foramen, measuring up to 7 mm on image  27/115. This contributes to moderate right foraminal narrowing and  possible right L5 nerve root encroachment. The spinal canal and left  foramen are patent.   IMPRESSION:  1. Multilevel spondylosis associated with a convex right scoliosis.  Compared with previous MRI from 11/15/2019:  2. Mildly increased narrowing of the left lateral recess at L2-3.  3. Stable mild spinal stenosis at L3-4 with mildly increased  asymmetric narrowing of the left lateral recess and moderate  asymmetric left foraminal narrowing.  4. Stable mild foraminal narrowing bilaterally at L4-5.  5. Moderate right foraminal narrowing at L5-S1 due to disc bulging,  facet hypertrophy and a probable small synovial cyst projecting  anteriorly from the right facet joint into the right foramen.  Correlate clinically for right L5 radicular symptoms.   Electronically Signed    By: Elsie Perone M.D.    On: 03/07/2024 10:35    Impression 1. Acute on chronic low back pain with radiation into the right greater than left lateral thigh and lateral calf.  Clinically she has symptoms consistent with neurogenic claudication in an L5 distribution.  2.  Acute on chronic bilateral back pain with radiating pain in the bilateral anterior lateral thighs to the knee.  Clinically her symptoms are most consistent with lumbar radiculitis.  3.  Hypertension, hyperlipidemia   Plan 1.  Continue oxycodone  5 mg twice daily as needed as prescribed by [facility]. 2.  Continue with exercises previously learned in physical therapy. 3.  Order placed for right L5-S1 facet joint aspiration. 4.  Referral to neurosurgery Penne Sharps 5.  At this time she will follow-up with our office after facet joint injection with Dr. Avanell.  Future consideration could also be made for a spinal cord stimulator  trial referral.     BENTON DOWSE, NP  This note was generated in part with voice recognition software and I apologize for any typographical errors that were not detected and corrected.  PCP: Dr. Richerd Brigham

## 2024-04-15 ENCOUNTER — Non-Acute Institutional Stay: Payer: Self-pay | Admitting: Orthopedic Surgery

## 2024-04-15 ENCOUNTER — Encounter: Payer: Self-pay | Admitting: Orthopedic Surgery

## 2024-04-15 DIAGNOSIS — E871 Hypo-osmolality and hyponatremia: Secondary | ICD-10-CM | POA: Diagnosis not present

## 2024-04-15 DIAGNOSIS — E440 Moderate protein-calorie malnutrition: Secondary | ICD-10-CM | POA: Diagnosis not present

## 2024-04-15 DIAGNOSIS — M810 Age-related osteoporosis without current pathological fracture: Secondary | ICD-10-CM

## 2024-04-15 DIAGNOSIS — F330 Major depressive disorder, recurrent, mild: Secondary | ICD-10-CM

## 2024-04-15 DIAGNOSIS — I10 Essential (primary) hypertension: Secondary | ICD-10-CM

## 2024-04-15 DIAGNOSIS — K580 Irritable bowel syndrome with diarrhea: Secondary | ICD-10-CM

## 2024-04-15 DIAGNOSIS — R600 Localized edema: Secondary | ICD-10-CM | POA: Diagnosis not present

## 2024-04-15 DIAGNOSIS — M48061 Spinal stenosis, lumbar region without neurogenic claudication: Secondary | ICD-10-CM

## 2024-04-15 MED ORDER — PREDNISONE 20 MG PO TABS: 20.0000 mg | ORAL_TABLET | Freq: Every day | ORAL | Status: AC

## 2024-04-15 MED ORDER — ACETAMINOPHEN 325 MG PO TABS: 650.0000 mg | ORAL_TABLET | ORAL | Status: AC | PRN

## 2024-04-15 NOTE — Progress Notes (Signed)
 Location:  Other [facility].  Nursing Home Room Number: [place] [id number] Place of Service:  ALF [phone]) Provider:  Greig Cluster, NP  PCP: Laurence Locus, DO  Patient Care Team: Laurence Locus, DO as PCP - General (Internal Medicine)  Extended Emergency Contact Information Primary Emergency Contact: smith,beverly Mobile Phone: [phone] Relation: Sister Secondary Emergency Contact: Smith,chuck Mobile Phone: [phone] Relation: Brother  Code Status:  DNR Goals of care: Advanced Directive information    04/15/2024    9:07 AM  Advanced Directives  Does Patient Have a Medical Advance Directive? Yes  Type of Advance Directive Out of facility DNR (pink MOST or yellow form)  Does patient want to make changes to medical advance directive? No - Patient declined     Chief Complaint  Patient presents with   Medical Management of Chronic Issues    Medical Management of Chronic Issues.     HPI:  Pt is a 89 y.o. female seen today for medical management of chronic diseases.    She currently resides on the assisted living unit at [facility] PMH: HTN, IBS, scoliosis/ kyphoscoliosis, chronic back pain d/t spinal stenosis and multilevel spondylosis, osteoporosis, HLD, depression and malnutrition.   Malnutrition- BMI 17.57, albumin 3.2, protein 4.9 09/17/2023, remains on medpass HTN- BUN/creat 21/0.9 10/01/2023, remains on lisinopril  Lower leg edema- wears compression stockings, remains on furosemide Hyponatremia- Na+ 128 10/01/2023, not on supplement  IBS- remains on Questran and imodium Osteoporosis- fosamax discontinued, remains on calcium supplement Spinal stenosis- followed by ortho, continues ot have low back pain with radiation down legs, 10/22 epidural steroid injection, scheduled to discuss facet joint aspiration, remains on oxycodone   Depression- no changes in mood, supportive family, remains on Celexa    Recent weights:  11/17- 87 lbs  11/03- 89 lbs  10/15- 89 lbs  10/06-  89 lbs  Recent blood pressures:  11/15- 100/65  10/15- 131/62  09/15- 108/68     Past Medical History:  Diagnosis Date   Arthritis    Elevated lipids    Encounter for diagnostic colonoscopy due to change in bowel habits    Hypertension    Scoliosis    Past Surgical History:  Procedure Laterality Date   CATARACT EXTRACTION     COLONOSCOPY WITH PROPOFOL  N/A 11/23/2017   Procedure: COLONOSCOPY WITH PROPOFOL ;  Surgeon: Viktoria Lamar DASEN, MD;  Location: [facility] ENDOSCOPY;  Service: Endoscopy;  Laterality: N/A;   INTRAMEDULLARY (IM) NAIL INTERTROCHANTERIC Left 07/15/2023   Procedure: INTRAMEDULLARY (IM) NAIL INTERTROCHANTERIC;  Surgeon: Lorelle Hussar, MD;  Location: [facility] ORS;  Service: Orthopedics;  Laterality: Left;   TONSILLECTOMY      No Known Allergies  Outpatient Encounter Medications as of 04/15/2024  Medication Sig   acetaminophen  (TYLENOL ) 325 MG tablet Take 2 tablets (650 mg total) by mouth every 6 (six) hours as needed for mild pain (pain score 1-3), fever or headache. (Patient taking differently: Take 650 mg by mouth every 4 (four) hours as needed for mild pain (pain score 1-3), fever or headache. Give 2 tablets by mouth in the morning.)   aspirin EC 81 MG tablet Take 81 mg by mouth daily.   bismuth subsalicylate (PEPTO BISMOL) 262 MG/15ML suspension Take 30 mLs by mouth every 4 (four) hours as needed.   calcium-vitamin D (OSCAL WITH D) 500-5 MG-MCG tablet Take 1 tablet by mouth at bedtime.   carbamide peroxide (DEBROX) 6.5 % OTIC solution 5 drops as needed.   cetirizine (ZYRTEC) 5 MG tablet Take 5 mg by mouth as needed  for allergies.   cholecalciferol (VITAMIN D3) 25 MCG (1000 UNIT) tablet Take 1,000 Units by mouth daily.   cholestyramine (QUESTRAN) 4 g packet Take 4 g by mouth daily.   citalopram  (CELEXA ) 20 MG tablet Take 20 mg by mouth daily.   dextromethorphan-guaiFENesin (ROBITUSSIN-DM) 10-100 MG/5ML liquid Take 10 mLs by mouth every 4 (four) hours as needed for  cough.   dextrose  (GLUTOSE) 40 % GEL Take 1 Tube by mouth as needed for low blood sugar.   furosemide (LASIX) 40 MG tablet Take 40 mg by mouth daily.   lisinopril  (ZESTRIL ) 40 MG tablet Take 1 tablet (40 mg total) by mouth daily.   loperamide (IMODIUM) 2 MG capsule Take 2 mg by mouth as needed for diarrhea or loose stools.   magnesium hydroxide (MILK OF MAGNESIA) 400 MG/5ML suspension Take 15 mLs by mouth daily as needed for mild constipation.   methocarbamol  (ROBAXIN ) 500 MG tablet Take 250 mg by mouth every 12 (twelve) hours as needed for muscle spasms.   multivitamin-lutein (OCUVITE-LUTEIN) CAPS capsule Take 2 capsules by mouth daily.   nystatin (MYCOSTATIN/NYSTOP) powder Apply 1 Application topically 2 (two) times daily as needed.   Omega-3 Fatty Acids (FISH OIL CONCENTRATE PO) Take 1 capsule by mouth daily.   ondansetron  (ZOFRAN ) 4 MG tablet Take 4 mg by mouth every 8 (eight) hours as needed for nausea or vomiting.   oxyCODONE  (OXY IR/ROXICODONE ) 5 MG immediate release tablet Take 1 tablet (5 mg total) by mouth in the morning and at bedtime.   OXYGEN Inhale into the lungs. 2lpm   potassium chloride SA (KLOR-CON M) 20 MEQ tablet Take 20 mEq by mouth daily.   vitamin E (VITAMIN E) 1000 UNIT capsule Take 1,000 Units by mouth daily.   alendronate (FOSAMAX) 70 MG tablet Take 70 mg by mouth once a week. Take with a full glass of water on an empty stomach. (Patient not taking: Reported on 04/15/2024)   BIOGAIA PROBIOTIC (BIOGAIA PROBIOTIC) LIQD Take 5 drops by mouth daily at 8 pm. (Patient not taking: Reported on 04/15/2024)   No facility-administered encounter medications on file as of 04/15/2024.    Review of Systems  Constitutional:  Negative for fatigue and fever.  HENT:  Negative for sore throat and trouble swallowing.   Eyes:  Negative for visual disturbance.  Respiratory:  Negative for cough and shortness of breath.   Cardiovascular:  Positive for leg swelling. Negative for chest pain.   Gastrointestinal:  Positive for constipation and diarrhea. Negative for abdominal distention and abdominal pain.  Genitourinary: Negative.   Musculoskeletal:  Positive for arthralgias, back pain and gait problem.  Skin:  Negative for wound.  Neurological:  Positive for weakness. Negative for dizziness and facial asymmetry.  Psychiatric/Behavioral:  Positive for dysphoric mood. Negative for confusion and sleep disturbance. The patient is not nervous/anxious.     Immunization History  Administered Date(s) Administered   Fluzone Influenza virus vaccine,trivalent (IIV3), split virus 01/25/2011, 02/24/2012   Influenza Inj Mdck Quad Pf 02/18/2016   Influenza-Unspecified 02/13/2014, 03/01/2015, 01/21/2018, 03/08/2024   PFIZER Comirnaty(Gray Top)Covid-19 Tri-Sucrose Vaccine 09/18/2020   Pneumococcal Conjugate-13 10/31/2014   Pneumococcal Polysaccharide-23 06/11/2009, 02/19/2017   Respiratory Syncytial Virus Vaccine,Recomb Aduvanted(Arexvy) 02/24/2022   Unspecified SARS-COV-2 Vaccination 06/18/2019, 07/09/2019, 02/16/2020, 03/18/2024   Zoster Recombinant(Shingrix) 11/10/2017, 01/21/2018   Pertinent  Health Maintenance Due  Topic Date Due   Bone Density Scan  Never done   Influenza Vaccine  Completed       No data to display  Functional Status Survey:    Vitals:   04/15/24 0853  BP: 100/65  Pulse: 73  Resp: 18  Temp: (!) 97.5 F (36.4 C)  SpO2: 94%  Weight: 87 lb (39.5 kg)  Height: 4' 11 (1.499 m)   Body mass index is 17.57 kg/m. Physical Exam Constitutional:      Appearance: She is cachectic.  HENT:     Head: Normocephalic.  Eyes:     General:        Right eye: No discharge.        Left eye: No discharge.  Cardiovascular:     Rate and Rhythm: Normal rate and regular rhythm.     Pulses: Normal pulses.     Heart sounds: Normal heart sounds.  Pulmonary:     Effort: Pulmonary effort is normal.     Breath sounds: Normal breath sounds.  Abdominal:      General: Bowel sounds are normal. There is no distension.     Palpations: Abdomen is soft.     Tenderness: There is no abdominal tenderness.  Musculoskeletal:     Cervical back: Neck supple.     Right lower leg: No edema.     Left lower leg: No edema.  Skin:    General: Skin is warm.     Capillary Refill: Capillary refill takes less than 2 seconds.  Neurological:     General: No focal deficit present.     Mental Status: She is oriented to person, place, and time.     Motor: Weakness present.     Gait: Gait abnormal.  Psychiatric:        Mood and Affect: Mood normal.     Labs reviewed: Recent Labs    07/16/23 0529 07/17/23 0553 07/18/23 0425 07/23/23 0000 09/24/23 0000 10/01/23 0000  NA 130* 131* 132* 130* 127* 128*  K 3.9 3.9 3.9 3.6 4.8 5.1  CL 96* 95* 97* 94* 90* 92*  CO2 28 28 28  26* 29* 30*  GLUCOSE 99 113* 103*  --   --   --   BUN 10 15 21 20 19 21   CREATININE 0.78 0.68 0.76 0.8 0.8 0.9  CALCIUM 8.3* 8.2* 8.2* 8.0* 8.7 8.7   Recent Labs    07/14/23 1655  AST 27  ALT 25  ALKPHOS 61  BILITOT 0.8  PROT 6.3*  ALBUMIN 3.7   Recent Labs    07/14/23 1655 07/15/23 0757 07/16/23 0529 07/17/23 0553 07/18/23 0425 07/23/23 0000  WBC 20.9* 10.5 10.1 10.3 10.0 18.4  NEUTROABS 18.6* 8.3* 7.6  --   --   --   HGB 10.6* 9.8* 8.9* 8.9* 8.3* 9.8*  HCT 30.7* 28.4* 26.4* 25.3* 24.7* 30*  MCV 84.1 84.8 86.0 84.6 86.1  --   PLT 337 302 291 288 328 690*   No results found for: TSH No results found for: HGBA1C No results found for: CHOL, HDL, LDLCALC, LDLDIRECT, TRIG, CHOLHDL  Significant Diagnostic Results in last 30 days:  No results found.  Assessment/Plan 1. Malnutrition of moderate degree (Primary) - ongoing - BMI 17 - cont medpass - cont weekly weights  2. Primary hypertension - controlled with lisinopril   3. Lower leg edema - non pitting - cont furosemide  4. Hyponatremia - last Na+ 128 - on SSRI and diuretic -  asymptomatic  5. Irritable bowel syndrome with diarrhea - stable with Questran and Imodium  6. Osteoporosis, unspecified osteoporosis type, unspecified pathological fracture presence - off Fosamax - cont calcium supplement  7. Spinal stenosis, lumbar region without neurogenic claudication - ongoing - followed by ortho - scheduled to see neurosurgery to discuss facet joint aspiration - cont oxycodone   - will start prednisone  20 mg x 10 days due to increased pain  8. Mild episode of recurrent major depressive disorder - no changes in mood - on Celexa  - consider Cymbalta in future due to chronic pain     Family/ staff Communication: plan discussed with   Labs/tests ordered:  cbc/diff, cmp, TSH 12/01

## 2024-04-25 LAB — COMPREHENSIVE METABOLIC PANEL WITH GFR
Albumin: 3.9 (ref 3.5–5.0)
Calcium: 9.2 (ref 8.7–10.7)
Globulin: 2.2
eGFR: 44

## 2024-04-25 LAB — CBC AND DIFFERENTIAL
HCT: 39 (ref 36–46)
Hemoglobin: 13 (ref 12.0–16.0)
Neutrophils Absolute: 8241
Platelets: 432 10*3/uL — AB (ref 150–400)
WBC: 13.4

## 2024-04-25 LAB — BASIC METABOLIC PANEL WITH GFR
BUN: 29 — AB (ref 4–21)
CO2: 29 — AB (ref 13–22)
Chloride: 95 — AB (ref 99–108)
Creatinine: 1.2 — AB (ref 0.5–1.1)
Glucose: 97
Potassium: 4.5 meq/L (ref 3.5–5.1)
Sodium: 133 — AB (ref 137–147)

## 2024-04-25 LAB — CBC: RBC: 4.47 (ref 3.87–5.11)

## 2024-04-25 LAB — HEPATIC FUNCTION PANEL
ALT: 8 U/L (ref 7–35)
AST: 12 — AB (ref 13–35)
Alkaline Phosphatase: 61 (ref 25–125)
Bilirubin, Total: 0.7

## 2024-04-25 LAB — TSH: TSH: 0.76 (ref 0.41–5.90)

## 2024-05-09 ENCOUNTER — Other Ambulatory Visit: Payer: Self-pay | Admitting: Nurse Practitioner

## 2024-05-09 ENCOUNTER — Other Ambulatory Visit: Payer: Self-pay

## 2024-05-09 ENCOUNTER — Inpatient Hospital Stay
Admission: RE | Admit: 2024-05-09 | Discharge: 2024-05-09 | Disposition: A | Payer: Self-pay | Source: Ambulatory Visit | Attending: Physician Assistant | Admitting: Physician Assistant

## 2024-05-09 DIAGNOSIS — Z049 Encounter for examination and observation for unspecified reason: Secondary | ICD-10-CM

## 2024-05-09 DIAGNOSIS — M48061 Spinal stenosis, lumbar region without neurogenic claudication: Secondary | ICD-10-CM

## 2024-05-09 NOTE — Progress Notes (Signed)
 "  Referring Physician:  Laurence Locus, DO [address] [place],  [place] [address]  Primary Physician:  Laurence Locus, DO  History of Present Illness: 05/10/2024 [name] is here today with a chief complaint of low back pain and known lumbar stenosis presenting today with radiating pain into bilateral buttock, lateral thighs and calves.  It does not extend past the level of the ankle.  She recently underwent an injection, but feels as though it helped very little and she had more pain starting the next day.  She has not fallen since breaking her hip earlier this year.  She is using a walker and feeling steady on her feet.     Bowel/Bladder Dysfunction: none  Conservative measures:  Physical therapy:  Has not participated in.  Multimodal medical therapy including regular antiinflammatories:  acetaminophen , methocarbamol , oxycodone , Salonpas patches, BenGay Injections:  03/25/2024 Right L4-5  03/25/2024 Right L5-S1    The symptoms are causing a significant impact on the patient's life.   Review of Systems:  A 10 point review of systems is negative, except for the pertinent positives and negatives detailed in the HPI.  Past Medical History: Past Medical History:  Diagnosis Date   Arthritis    Elevated lipids    Encounter for diagnostic colonoscopy due to change in bowel habits    Hypertension    Scoliosis     Past Surgical History: Past Surgical History:  Procedure Laterality Date   CATARACT EXTRACTION     COLONOSCOPY WITH PROPOFOL  N/A 11/23/2017   Procedure: COLONOSCOPY WITH PROPOFOL ;  Surgeon: Viktoria Lamar DASEN, MD;  Location: [facility] ENDOSCOPY;  Service: Endoscopy;  Laterality: N/A;   INTRAMEDULLARY (IM) NAIL INTERTROCHANTERIC Left 07/15/2023   Procedure: INTRAMEDULLARY (IM) NAIL INTERTROCHANTERIC;  Surgeon: Lorelle Hussar, MD;  Location: [facility] ORS;  Service: Orthopedics;  Laterality: Left;   TONSILLECTOMY      Allergies: Allergies as of 05/10/2024   (No  Known Allergies)    Medications: Outpatient Encounter Medications as of 05/10/2024  Medication Sig   acetaminophen  (TYLENOL ) 325 MG tablet Take 2 tablets (650 mg total) by mouth every 4 (four) hours as needed for mild pain (pain score 1-3), fever or headache. Give 2 tablets by mouth in the morning.   aspirin EC 81 MG tablet Take 81 mg by mouth daily.   bismuth subsalicylate (PEPTO BISMOL) 262 MG/15ML suspension Take 30 mLs by mouth every 4 (four) hours as needed.   calcium-vitamin D (OSCAL WITH D) 500-5 MG-MCG tablet Take 1 tablet by mouth at bedtime.   carbamide peroxide (DEBROX) 6.5 % OTIC solution 5 drops as needed.   cetirizine (ZYRTEC) 5 MG tablet Take 5 mg by mouth as needed for allergies.   cholecalciferol (VITAMIN D3) 25 MCG (1000 UNIT) tablet Take 1,000 Units by mouth daily.   cholestyramine (QUESTRAN) 4 g packet Take 4 g by mouth daily.   citalopram  (CELEXA ) 20 MG tablet Take 20 mg by mouth daily.   dextromethorphan-guaiFENesin (ROBITUSSIN-DM) 10-100 MG/5ML liquid Take 10 mLs by mouth every 4 (four) hours as needed for cough.   dextrose  (GLUTOSE) 40 % GEL Take 1 Tube by mouth as needed for low blood sugar.   furosemide (LASIX) 40 MG tablet Take 40 mg by mouth daily.   lisinopril  (ZESTRIL ) 40 MG tablet Take 1 tablet (40 mg total) by mouth daily.   loperamide (IMODIUM) 2 MG capsule Take 2 mg by mouth as needed for diarrhea or loose stools.   magnesium hydroxide (MILK OF  MAGNESIA) 400 MG/5ML suspension Take 15 mLs by mouth daily as needed for mild constipation.   methocarbamol  (ROBAXIN ) 500 MG tablet Take 250 mg by mouth every 12 (twelve) hours as needed for muscle spasms.   multivitamin-lutein (OCUVITE-LUTEIN) CAPS capsule Take 2 capsules by mouth daily.   nystatin (MYCOSTATIN/NYSTOP) powder Apply 1 Application topically 2 (two) times daily as needed.   Omega-3 Fatty Acids (FISH OIL CONCENTRATE PO) Take 1 capsule by mouth daily.   ondansetron  (ZOFRAN ) 4 MG tablet Take 4 mg by mouth  every 8 (eight) hours as needed for nausea or vomiting.   oxyCODONE  (OXY IR/ROXICODONE ) 5 MG immediate release tablet Take 1 tablet (5 mg total) by mouth in the morning and at bedtime.   OXYGEN Inhale into the lungs. 2lpm   potassium chloride SA (KLOR-CON M) 20 MEQ tablet Take 20 mEq by mouth daily.   vitamin E (VITAMIN E) 1000 UNIT capsule Take 1,000 Units by mouth daily.   No facility-administered encounter medications on file as of 05/10/2024.    Social History: Social History[1]  Family Medical History: No family history on file.  Physical Examination: @VITALWITHPAIN @  General: Patient is well developed, well nourished, calm, collected, and in no apparent distress. Attention to examination is appropriate.  Psychiatric: Patient is non-anxious.  Head:  Pupils equal, round, and reactive to light.  ENT:  Oral mucosa appears well hydrated.  Neck:   Supple.  Full range of motion.  Respiratory: Patient is breathing without any difficulty.  Extremities: No edema.  Vascular: Palpable dorsal pedal pulses.  Skin:   On exposed skin, there are no abnormal skin lesions.  NEUROLOGICAL:     Awake, alert, oriented to person, place, and time.  Speech is clear and fluent. Fund of knowledge is appropriate.   Cranial Nerves: Pupils equal round and reactive to light.  Facial tone is symmetric.   ROM of spine: Some tenderness palpation of lumbar spine.  Strength:  Side Iliopsoas Quads Hamstring PF DF EHL  R 5 5 5 5 5 5   L 5 5 5 5  4- 5   Clonus is not present.  Toes are down-going.  Bilateral upper and lower extremity sensation is intact to light touch.      Medical Decision Making  Imaging: IMPRESSION: 1. Multilevel spondylosis associated with a convex right scoliosis. Compared with previous MRI from 11/15/2019: 2. Mildly increased narrowing of the left lateral recess at L2-3. 3. Stable mild spinal stenosis at L3-4 with mildly increased asymmetric narrowing of the left  lateral recess and moderate asymmetric left foraminal narrowing. 4. Stable mild foraminal narrowing bilaterally at L4-5. 5. Moderate right foraminal narrowing at L5-S1 due to disc bulging, facet hypertrophy and a probable small synovial cyst projecting anteriorly from the right facet joint into the right foramen. Correlate clinically for right L5 radicular symptoms.  I have personally reviewed the images and agree with the above interpretation.  Assessment and Plan: Ms. Leece is a pleasant 89 y.o. female with known multilevel lumbar spondylosis with a convex right scoliosis with moderate foraminal narrowing on her right side at L5-S1.  There is a small synovial cyst possibly projecting into her right foramen at L5.  She is having constant pain in her back that radiates down the back and outside of bilateral lower extremities and stops at the level of her ankle.  One side is not worse than the other, but she has increased dorsiflexion weakness on the left side which is the contralateral side in which the  cyst is present.  She recently had right L4-5 and L5-S1 injection which provided little to no relief.  Talked with the patient and their family regarding her pain distribution and that I do not believe all of her pain is related to the small synovial cyst seen on MRI and that there are other contributing factors.  Neuropathic pain is a concern and we did talk about gabapentin  at length and plan to trial this at a low dose.  The risks and benefits of this were discussed at length.  Unsure if this synovial cyst is due to some dynamic instability.  Will have patient undergo flexion extension films while in clinic today.  I have also recommended patient undergo physical therapy.  Will discuss further with surgical team.  Will plan to see back in approximately 7 weeks.   Thank you for involving me in the care of this patient.     Lyle Decamp, PA-C Dept. of Neurosurgery      [1]  Social  History Tobacco Use   Smoking status: Former    Current packs/day: 0.00    Types: Cigarettes    Quit date: 07/17/2011    Years since quitting: 12.8   Smokeless tobacco: Never  Vaping Use   Vaping status: Never Used  Substance Use Topics   Alcohol use: Never   Drug use: Never   "

## 2024-05-09 NOTE — Telephone Encounter (Signed)
 Pharmacy Requested refill.  [facility] Last RF: 04/05/2024  Pended Rx and sent to [facility] for approval.

## 2024-05-10 ENCOUNTER — Ambulatory Visit

## 2024-05-10 ENCOUNTER — Encounter: Payer: Self-pay | Admitting: Physician Assistant

## 2024-05-10 ENCOUNTER — Ambulatory Visit: Admitting: Physician Assistant

## 2024-05-10 VITALS — BP 116/70 | Wt 87.0 lb

## 2024-05-10 DIAGNOSIS — M419 Scoliosis, unspecified: Secondary | ICD-10-CM

## 2024-05-10 DIAGNOSIS — M48062 Spinal stenosis, lumbar region with neurogenic claudication: Secondary | ICD-10-CM | POA: Diagnosis not present

## 2024-05-10 DIAGNOSIS — M47816 Spondylosis without myelopathy or radiculopathy, lumbar region: Secondary | ICD-10-CM

## 2024-05-10 MED ORDER — GABAPENTIN 100 MG PO CAPS: 100.0000 mg | ORAL_CAPSULE | Freq: Three times a day (TID) | ORAL | 2 refills | Status: AC

## 2024-06-20 ENCOUNTER — Ambulatory Visit: Admitting: Cardiology

## 2024-06-23 ENCOUNTER — Encounter: Payer: Self-pay | Admitting: Nurse Practitioner

## 2024-06-23 ENCOUNTER — Non-Acute Institutional Stay: Payer: Self-pay | Admitting: Nurse Practitioner

## 2024-06-23 DIAGNOSIS — E440 Moderate protein-calorie malnutrition: Secondary | ICD-10-CM | POA: Diagnosis not present

## 2024-06-23 DIAGNOSIS — R600 Localized edema: Secondary | ICD-10-CM

## 2024-06-23 DIAGNOSIS — E871 Hypo-osmolality and hyponatremia: Secondary | ICD-10-CM

## 2024-06-23 NOTE — Progress Notes (Signed)
 " Location:  Other [facility].  Nursing Home Room Number: [facility] [place] [id number] Place of Service:  ALF [phone]) Harlene An, NP  PCP: Laurence Locus, DO  Patient Care Team: Laurence Locus, DO as PCP - General (Internal Medicine)  Extended Emergency Contact Information Primary Emergency Contact: smith,beverly Mobile Phone: [phone] Relation: Sister Secondary Emergency Contact: Smith,chuck Mobile Phone: [phone] Relation: Brother  Goals of care: Advanced Directive information    06/23/2024    4:42 PM  Advanced Directives  Does Patient Have a Medical Advance Directive? Yes  Type of Advance Directive Out of facility DNR (pink MOST or yellow form)  Does patient want to make changes to medical advance directive? No - Patient declined     Chief Complaint  Patient presents with   Edema    Increased Edema    HPI:  Pt is a 89 y.o. female seen today for an acute visit for Increased Edema She has been experiencing leg swelling that initially improved with the use of unnaboots and compression hose, which made her legs appear much thinner. She has stopped this and now the swelling has returned.   She is currently taking Lasix 40 mg daily to manage the swelling. Despite elevating her legs during the day, it does not seem to help. No shortness of breath, cough, or congestion.  Her weight has been stable, although she might weigh a little more than before. She eats three meals a day, including an egg every morning, bacon, and toast. Her protein levels are on the low end of normal. She has a history of malnutrition.  In the past, her legs were wrapped three times a week, which helped reduce the swelling significantly.   Past Medical History:  Diagnosis Date   Arthritis    Elevated lipids    Encounter for diagnostic colonoscopy due to change in bowel habits    Hypertension    Scoliosis    Past Surgical History:  Procedure Laterality Date   CATARACT EXTRACTION     COLONOSCOPY WITH  PROPOFOL  N/A 11/23/2017   Procedure: COLONOSCOPY WITH PROPOFOL ;  Surgeon: Viktoria Lamar DASEN, MD;  Location: [facility] ENDOSCOPY;  Service: Endoscopy;  Laterality: N/A;   INTRAMEDULLARY (IM) NAIL INTERTROCHANTERIC Left 07/15/2023   Procedure: INTRAMEDULLARY (IM) NAIL INTERTROCHANTERIC;  Surgeon: Lorelle Hussar, MD;  Location: [facility] ORS;  Service: Orthopedics;  Laterality: Left;   TONSILLECTOMY      Allergies[1]  Outpatient Encounter Medications as of 06/23/2024  Medication Sig   acetaminophen  (TYLENOL ) 325 MG tablet Take 2 tablets (650 mg total) by mouth every 4 (four) hours as needed for mild pain (pain score 1-3), fever or headache. Give 2 tablets by mouth in the morning.   aluminum-magnesium hydroxide 200-200 MG/5ML suspension Take 10 mLs by mouth every 4 (four) hours as needed for indigestion.   aspirin EC 81 MG tablet Take 81 mg by mouth daily.   bismuth subsalicylate (PEPTO BISMOL) 262 MG/15ML suspension Take 30 mLs by mouth every 4 (four) hours as needed.   calcium-vitamin D (OSCAL WITH D) 500-5 MG-MCG tablet Take 1 tablet by mouth at bedtime.   carbamide peroxide (DEBROX) 6.5 % OTIC solution 5 drops as needed.   cetirizine (ZYRTEC) 5 MG tablet Take 5 mg by mouth as needed for allergies.   Cholecalciferol (VITAMIN D) 125 MCG CAPS Take 5,000 Units by mouth daily.   cholestyramine (QUESTRAN) 4 g packet Take 4 g by mouth daily.   citalopram  (CELEXA ) 20 MG tablet Take 20 mg by mouth daily.  dextromethorphan-guaiFENesin (ROBITUSSIN-DM) 10-100 MG/5ML liquid Take 10 mLs by mouth every 4 (four) hours as needed for cough.   dextrose  (GLUTOSE) 40 % GEL Take 1 Tube by mouth as needed for low blood sugar.   docusate sodium  (COLACE) 100 MG capsule Take 100 mg by mouth daily.   furosemide (LASIX) 40 MG tablet Take 40 mg by mouth daily.   gabapentin  (NEURONTIN ) 100 MG capsule Take 1 capsule (100 mg total) by mouth 3 (three) times daily.   lisinopril  (ZESTRIL ) 40 MG tablet Take 1 tablet (40 mg total) by  mouth daily.   loperamide (IMODIUM) 2 MG capsule Take 2 mg by mouth as needed for diarrhea or loose stools.   magnesium hydroxide (MILK OF MAGNESIA) 400 MG/5ML suspension Take 15 mLs by mouth daily as needed for mild constipation.   methocarbamol  (ROBAXIN ) 500 MG tablet Take 250 mg by mouth every 12 (twelve) hours as needed for muscle spasms.   multivitamin-lutein (OCUVITE-LUTEIN) CAPS capsule Take 2 capsules by mouth daily.   nystatin (MYCOSTATIN/NYSTOP) powder Apply 1 Application topically 2 (two) times daily as needed.   Omega-3 Fatty Acids (FISH OIL CONCENTRATE PO) Take 1 capsule by mouth daily.   ondansetron  (ZOFRAN ) 4 MG tablet Take 4 mg by mouth every 8 (eight) hours as needed for nausea or vomiting.   oxyCODONE  (OXY IR/ROXICODONE ) 5 MG immediate release tablet TAKE 1 TABLET BY MOUTH TWICE DAILY   OXYGEN Inhale into the lungs. 2lpm   polyethylene glycol (MIRALAX / GLYCOLAX) 17 g packet Take 17 g by mouth daily as needed.   potassium chloride SA (KLOR-CON M) 20 MEQ tablet Take 20 mEq by mouth daily.   vitamin E (VITAMIN E) 1000 UNIT capsule Take 1,000 Units by mouth daily.   oxyCODONE  (OXY IR/ROXICODONE ) 5 MG immediate release tablet Take 1 tablet (5 mg total) by mouth 2 (two) times daily. (Patient not taking: Reported on 06/23/2024)   [START ON 07/11/2024] oxyCODONE  (OXY IR/ROXICODONE ) 5 MG immediate release tablet Take 1 tablet (5 mg total) by mouth 2 (two) times daily. (Patient not taking: Reported on 06/23/2024)   No facility-administered encounter medications on file as of 06/23/2024.    Review of Systems  Constitutional:  Negative for activity change, appetite change, fatigue and unexpected weight change.  HENT:  Negative for congestion and hearing loss.   Eyes: Negative.   Respiratory:  Negative for cough and shortness of breath.   Cardiovascular:  Positive for leg swelling. Negative for chest pain and palpitations.  Genitourinary:  Negative for difficulty urinating and dysuria.   Musculoskeletal:  Negative for arthralgias and myalgias.  Skin:  Negative for color change and wound.  Neurological:  Negative for dizziness and weakness.  Psychiatric/Behavioral:  Negative for agitation, behavioral problems and confusion.     Immunization History  Administered Date(s) Administered   Fluzone Influenza virus vaccine,trivalent (IIV3), split virus 01/25/2011, 02/24/2012   Influenza Inj Mdck Quad Pf 02/18/2016   Influenza-Unspecified 02/13/2014, 03/01/2015, 01/21/2018, 03/08/2024   PFIZER Comirnaty(Gray Top)Covid-19 Tri-Sucrose Vaccine 09/18/2020   Pneumococcal Conjugate-13 10/31/2014   Pneumococcal Polysaccharide-23 06/11/2009, 02/19/2017   Respiratory Syncytial Virus Vaccine,Recomb Aduvanted(Arexvy) 02/24/2022   Unspecified SARS-COV-2 Vaccination 06/18/2019, 07/09/2019, 02/16/2020, 03/18/2024   Zoster Recombinant(Shingrix) 11/10/2017, 01/21/2018   Pertinent  Health Maintenance Due  Topic Date Due   Bone Density Scan  Never done   Influenza Vaccine  Completed       No data to display         Functional Status Survey:    Vitals:  06/23/24 1627  BP: (!) 96/55  Pulse: 77  Resp: 18  Temp: (!) 97.5 F (36.4 C)  SpO2: 91%  Weight: 90 lb 6.4 oz (41 kg)  Height: 4' 11 (1.499 m)   Body mass index is 18.26 kg/m. Physical Exam Constitutional:      General: She is not in acute distress.    Appearance: She is well-developed. She is not diaphoretic.  HENT:     Head: Normocephalic and atraumatic.     Mouth/Throat:     Pharynx: No oropharyngeal exudate.  Eyes:     Conjunctiva/sclera: Conjunctivae normal.     Pupils: Pupils are equal, round, and reactive to light.  Cardiovascular:     Rate and Rhythm: Normal rate and regular rhythm.     Heart sounds: Normal heart sounds.  Pulmonary:     Effort: Pulmonary effort is normal.     Breath sounds: Normal breath sounds.  Abdominal:     General: Bowel sounds are normal.     Palpations: Abdomen is soft.   Musculoskeletal:     Cervical back: Normal range of motion and neck supple.     Right lower leg: Edema present.     Left lower leg: Edema present.  Skin:    General: Skin is warm and dry.  Neurological:     Mental Status: She is alert.  Psychiatric:        Mood and Affect: Mood normal.     Labs reviewed: Recent Labs    07/16/23 0529 07/17/23 0553 07/18/23 0425 07/23/23 0000 09/24/23 0000 10/01/23 0000 04/25/24 0000  NA 130* 131* 132*   < > 127* 128* 133*  K 3.9 3.9 3.9   < > 4.8 5.1 4.5  CL 96* 95* 97*   < > 90* 92* 95*  CO2 28 28 28    < > 29* 30* 29*  GLUCOSE 99 113* 103*  --   --   --   --   BUN 10 15 21    < > 19 21 29*  CREATININE 0.78 0.68 0.76   < > 0.8 0.9 1.2*  CALCIUM 8.3* 8.2* 8.2*   < > 8.7 8.7 9.2   < > = values in this interval not displayed.   Recent Labs    07/14/23 1655 04/25/24 0000  AST 27 12*  ALT 25 8  ALKPHOS 61 61  BILITOT 0.8  --   PROT 6.3*  --   ALBUMIN 3.7 3.9   Recent Labs    07/15/23 0757 07/16/23 0529 07/17/23 0553 07/18/23 0425 07/23/23 0000 04/25/24 0000  WBC 10.5 10.1 10.3 10.0 18.4 13.4  NEUTROABS 8.3* 7.6  --   --   --  8,241.00  HGB 9.8* 8.9* 8.9* 8.3* 9.8* 13.0  HCT 28.4* 26.4* 25.3* 24.7* 30* 39  MCV 84.8 86.0 84.6 86.1  --   --   PLT 302 291 288 328 690* 432*   Lab Results  Component Value Date   TSH 0.76 04/25/2024   No results found for: HGBA1C No results found for: CHOL, HDL, LDLCALC, LDLDIRECT, TRIG, CHOLHDL  Significant Diagnostic Results in last 30 days:  No results found.  Assessment/Plan Chronic lower extremity edema Current compression stockings may be inadequate. Potential protein deficiency contributing to edema. - Obtain medical-grade compression stockings with appropriate compression level. - Ensure legs are elevated to heart level during the day. - Monitor kidney function and electrolytes. - Consider leg wrapping if compression stockings are ineffective.  Protein-calorie  malnutrition Low  protein levels potentially contributing to edema. - Increase dietary protein intake. - Consider protein supplements between meals.  Hyponatremia Stable on recent labs Will monitor.    Thaddeus Evitts K. Caro BODILY [facility] [phone]      [1] No Known Allergies  "

## 2024-07-15 ENCOUNTER — Ambulatory Visit: Admitting: Cardiovascular Disease
